# Patient Record
Sex: Male | Born: 1935 | Race: White | Hispanic: No | Marital: Married | Smoking: Former smoker
Health system: Southern US, Community
[De-identification: ages and names within clinical notes are randomized; demographics above are authoritative.]

## PROBLEM LIST (undated history)

## (undated) DIAGNOSIS — G4730 Sleep apnea, unspecified: Secondary | ICD-10-CM

## (undated) DIAGNOSIS — R918 Other nonspecific abnormal finding of lung field: Secondary | ICD-10-CM

## (undated) DIAGNOSIS — I10 Essential (primary) hypertension: Secondary | ICD-10-CM

## (undated) DIAGNOSIS — E119 Type 2 diabetes mellitus without complications: Secondary | ICD-10-CM

## (undated) DIAGNOSIS — G629 Polyneuropathy, unspecified: Secondary | ICD-10-CM

## (undated) DIAGNOSIS — R112 Nausea with vomiting, unspecified: Secondary | ICD-10-CM

## (undated) DIAGNOSIS — D649 Anemia, unspecified: Secondary | ICD-10-CM

## (undated) DIAGNOSIS — R0902 Hypoxemia: Secondary | ICD-10-CM

## (undated) DIAGNOSIS — N189 Chronic kidney disease, unspecified: Secondary | ICD-10-CM

## (undated) DIAGNOSIS — N179 Acute kidney failure, unspecified: Secondary | ICD-10-CM

## (undated) DIAGNOSIS — M1990 Unspecified osteoarthritis, unspecified site: Secondary | ICD-10-CM

## (undated) DIAGNOSIS — J45909 Unspecified asthma, uncomplicated: Secondary | ICD-10-CM

## (undated) DIAGNOSIS — T4145XA Adverse effect of unspecified anesthetic, initial encounter: Secondary | ICD-10-CM

## (undated) DIAGNOSIS — R609 Edema, unspecified: Secondary | ICD-10-CM

## (undated) DIAGNOSIS — Z9889 Other specified postprocedural states: Secondary | ICD-10-CM

## (undated) DIAGNOSIS — C801 Malignant (primary) neoplasm, unspecified: Secondary | ICD-10-CM

## (undated) DIAGNOSIS — N429 Disorder of prostate, unspecified: Secondary | ICD-10-CM

## (undated) DIAGNOSIS — T8859XA Other complications of anesthesia, initial encounter: Secondary | ICD-10-CM

## (undated) HISTORY — PX: JOINT REPLACEMENT: SHX530

## (undated) HISTORY — PX: BACK SURGERY: SHX140

## (undated) HISTORY — PX: FOREARM SURGERY: SHX651

## (undated) HISTORY — PX: OTHER SURGICAL HISTORY: SHX169

## (undated) HISTORY — PX: APPENDECTOMY: SHX54

---

## 2001-01-01 ENCOUNTER — Emergency Department (HOSPITAL_COMMUNITY): Admission: EM | Admit: 2001-01-01 | Discharge: 2001-01-01 | Payer: Self-pay | Admitting: Emergency Medicine

## 2001-02-25 ENCOUNTER — Emergency Department (HOSPITAL_COMMUNITY): Admission: EM | Admit: 2001-02-25 | Discharge: 2001-02-25 | Payer: Self-pay | Admitting: Emergency Medicine

## 2003-04-12 ENCOUNTER — Ambulatory Visit (HOSPITAL_COMMUNITY): Admission: RE | Admit: 2003-04-12 | Discharge: 2003-04-12 | Payer: Self-pay | Admitting: Gastroenterology

## 2004-07-15 ENCOUNTER — Emergency Department (HOSPITAL_COMMUNITY): Admission: EM | Admit: 2004-07-15 | Discharge: 2004-07-15 | Payer: Self-pay | Admitting: Emergency Medicine

## 2004-09-06 ENCOUNTER — Ambulatory Visit: Payer: Self-pay

## 2004-09-21 ENCOUNTER — Inpatient Hospital Stay (HOSPITAL_COMMUNITY): Admission: EM | Admit: 2004-09-21 | Discharge: 2004-09-25 | Payer: Self-pay | Admitting: Emergency Medicine

## 2004-09-21 ENCOUNTER — Ambulatory Visit: Payer: Self-pay | Admitting: Sports Medicine

## 2004-12-18 ENCOUNTER — Encounter: Payer: Self-pay | Admitting: Orthopedic Surgery

## 2004-12-23 ENCOUNTER — Encounter: Payer: Self-pay | Admitting: Orthopedic Surgery

## 2005-01-23 ENCOUNTER — Encounter: Payer: Self-pay | Admitting: Orthopedic Surgery

## 2005-02-23 ENCOUNTER — Encounter: Payer: Self-pay | Admitting: Orthopedic Surgery

## 2006-05-04 ENCOUNTER — Emergency Department (HOSPITAL_COMMUNITY): Admission: EM | Admit: 2006-05-04 | Discharge: 2006-05-04 | Payer: Self-pay | Admitting: Emergency Medicine

## 2008-05-04 ENCOUNTER — Ambulatory Visit: Payer: Self-pay | Admitting: Internal Medicine

## 2008-08-26 ENCOUNTER — Ambulatory Visit: Payer: Self-pay | Admitting: Internal Medicine

## 2008-12-11 ENCOUNTER — Inpatient Hospital Stay: Payer: Self-pay | Admitting: Internal Medicine

## 2008-12-28 ENCOUNTER — Ambulatory Visit: Payer: Self-pay | Admitting: Specialist

## 2009-05-02 ENCOUNTER — Emergency Department: Payer: Self-pay | Admitting: Emergency Medicine

## 2009-07-04 ENCOUNTER — Ambulatory Visit: Payer: Self-pay | Admitting: Specialist

## 2010-01-09 ENCOUNTER — Ambulatory Visit: Payer: Self-pay | Admitting: Specialist

## 2010-07-20 ENCOUNTER — Ambulatory Visit: Payer: Self-pay | Admitting: Specialist

## 2010-11-10 NOTE — Discharge Summary (Signed)
Keith Cain, MARKOVIC NO.:  [id number]   MEDICAL RECORD NO.:  [id number]          PATIENT TYPE:  INP   LOCATION:  5501                         FACILITY:  [facility]   PHYSICIAN:  Pearlean Brownie, M.D.DATE OF BIRTH:  12/28/1931   DATE OF ADMISSION:  09/21/2004  DATE OF DISCHARGE:  09/25/2004                                 DISCHARGE SUMMARY   DISCHARGE DIAGNOSES:  1.  Pyelonephritis.  2.  Prostatitis.  3.  Urinary retention secondary to obstructive uropathy.  4.  Acute renal insufficiency.  5.  Diabetes.  6.  Hypertension.   DISCHARGE MEDICATIONS:  1.  Ciprofloxacin 500 mg b.i.d. for 25 days.  2.  Tiazac 180 mg once a day.  3.  Flomax 0.4 SR at bedtime.  4.  Aspirin 81 mg once a day.  5.  Zocor 20 mg p.o. daily.  6.  Tylenol 500 mg 1-2 tabs q.4h. p.r.n. for pain.   RECOMMENDED DIET:  Carbohydrate modified diabetic diet.   SPECIAL INSTRUCTIONS:  The patient was instructed to continue to use Foley  catheter until advised by the urologist.  Also was advised to keep the bag  in a level below the waist.   FOLLOW UP:  The patient had a followup appointment with Dr. Annabell Howells in 1-2  weeks after discharge and was instructed to follow up with primary care  [name], Dr. __________ in one week.   HOSPITAL COURSE:  [name] is a 77 year old white male who presented to the  emergency department complaining of a six month history of left flank pain.  Recent increased urinary frequency and urgency.  Had been diagnosed the week  prior with a urinary tract infection and completed a five day course of  ciprofloxacin.  A urine culture was obtained by primary care [name] during  the last visit and showed E. Coli growth with sensitivity to ciprofloxacin  but treatment did not help with symptoms.  The patient also developed a  temperature of 101 prior to admission which prompted him to come to the  emergency department.  He also presented with emesis for the last two days  prior to admission.  Findings on a physical exam on admission were  remarkable for abdominal tenderness in the left upper quadrant.  Enlarged  prostate, 3.5 cm, nodular with right-sided prostate tenderness and boggy  prostate.  Otherwise, within normal limits.  Admission labs showed urine  with specific gravity of 1.022, pH 6.5, negative for glucose, moderate  hemoglobin, small bilirubin, and 15 ketones.  Protein greater than 300.  Positive nitrites and moderate leukocytes.  CBC showed a white blood cell  count of 13.1, hemoglobin 14.6, hematocrit 41, platelets 243.  Polymorphins  appear predominate 88%.  B-met showed sodium 134, potassium 4.7, chloride  103, bicarb 25.8, BUN 21, creatinine 1.4, glucose 144.  CT of the abdomen  showed a small nodular lesion in the right adrenal gland measuring about 1.2  mm, likely incidental adrenal adenoma.  __________ left greater than right.  Soft tissue stranding suggesting urinary tract obstruction.  Findings  compatible with multifactorial spinal stenosis.  No obstructing calculus was  detected.   Problem 1. Pyelonephritis.  The patient was started on Rocephin and  ciprofloxacin to complete a five day course and a  Foley catheter was also  placed on admission to straight drain.  The patient was instructed to  continue use the catheter after discharge until ___________.  Dr. Annabell Howells was  consulted and recommended to continue antibiotic treatment with Cipro for a  minimum of 30 days.  On discharge date, the patient had received a total of  five days of antibiotic treatment.  Was afebrile over 24 hours and was  instructed to continue ciprofloxacin treatment for 25 days more.   Problem 2. Urinary retention secondary to obstructive uropathy.  Was  improved after the placement of the Foley catheter, likely secondary to  infection since it was bilateral and no evidence of stones was shown on the  CT.  On discharge date, the patient had a great diuresis of  4.3 L in 24  hours. The patient was discharged on Flomax and will be followed by Dr.  Annabell Howells in 1-2 weeks.   Problem 3. Diabetes was stable, diet controlled.  The patient was instructed  to continue a carbohydrate modified diet and also was placed on Zocor and  aspirin prior to discharge.   Problem 4. Hypertension.  Remained stable.  The patient was advised to  continue on Diltiazem.   On discharge date, the patient was feeling well, had a good p.o. intake.  Denied any pain over 24 hours.  Vitals:  Temperature 98.3, blood pressure  129/66, heart rate 60, respiratory rate 20.  CBG was 124.  O2 sat 98% on  room air.  His CBC showed a white blood cell count of 10.4, hemoglobin 13.6,  hematocrit 39.5, platelets 242.  B-met showed sodium 140, potassium 4.1,  chloride 108, CO2 25, BUN 7, creatinine 0.8, glucose 90.  Calcium 8.8.  The  patient was discharged in improved and stable condition, to follow up with  primary care [name] in one week and with the urologist in 1.5-2 weeks.      AM/MEDQ  D:  11/17/2004  T:  11/18/2004  Job:  [id number]

## 2010-11-10 NOTE — Op Note (Signed)
NAME:  Keith Cain, PICKRELL NO.:  [id number]   MEDICAL RECORD NO.:  [id number]                   PATIENT TYPE:  AMB   LOCATION:  ENDO                                 FACILITY:  [facility]   PHYSICIAN:  Danise Edge, M.D.                DATE OF BIRTH:  10-01-1931   DATE OF PROCEDURE:  04/12/2003  DATE OF DISCHARGE:                                 OPERATIVE REPORT   PROCEDURE:  Colonoscopy.   PROCEDURE INDICATION:  Mr. Torren Maffeo is a 77 year old male born July 28, 1931.  When Mr. Siebers developed left-sided abdominal pain approximately  four months ago which did not resolve, he underwent a CT scan of the abdomen  and pelvis, which apparently showed colonic diverticulosis.  Mr. Ramsaran  received therapy for presumed acute diverticulitis, and his abdominal pain  has resolved.  His brother has undergone colonoscopic exams to remove  colonic polyps.  Mr. Scogin is scheduled to undergo a screening colonoscopy  with polypectomy to prevent colon cancer.   MEDICATION ALLERGIES:  None.   CURRENT MEDICATIONS:  Altace, diltiazem, Zocor, aspirin, Fibercon.   PAST MEDICAL/SURGICAL HISTORY:  1. L3 laminectomy.  2. Total right hip replacement surgery.  3. Hypertension.  4. Hypercholesterolemia.  5. Gout.  6. Asthma.  7. Remote cigarette smoker.  8. Rotator cuff surgery.  9. Melanoma removed, 1997.  10.      Colonic diverticulosis.   FAMILY HISTORY:  Brother with colon polyps.   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  His 73 year old wife is  in good health.  His two daughters and his son are in excellent health.   ENDOSCOPIST:  Danise Edge, M.D.   PREMEDICATION:  Versed 5 mg, Demerol 50 mg.   DESCRIPTION OF PROCEDURE:  After obtaining informed consent, Mr. Luecke was  placed in the left lateral decubitus position.  I administered intravenous  Demerol and intravenous Versed to achieve conscious sedation for the  procedure.  The patient's blood  pressure, oxygen saturation, and cardiac  rhythm were monitored throughout the procedure and documented in the medical  record.   Anal inspection was normal.  Digital rectal exam revealed a non-nodular  prostate.  The Olympus pediatric colonoscope was introduced into the rectum  and advanced to the cecum.  Colonic preparation for the exam today was  excellent.   Rectum normal.   Sigmoid colon and descending colon:  Left colonic diverticulosis.   Splenic flexure normal.   Transverse colon normal.   Hepatic flexure normal.   Ascending colon normal.   Cecum and ileocecal valve normal.   ASSESSMENT:  1. Left colonic diverticulosis.  2. No endoscopic evidence for the presence of colorectal neoplasia.   RECOMMENDATIONS:  Repeat colonoscopy (optical or virtual) in five years.  Danise Edge, M.D.    MJ/MEDQ  D:  04/12/2003  T:  04/12/2003  Job:  [id number]   cc:   Lianne Bushy, M.D.  [address]  [place]  [place] [address]  Fax: [phone]

## 2010-11-10 NOTE — Consult Note (Signed)
NAMEDEVARIS, QUIRK NO.:  [id number]   MEDICAL RECORD NO.:  [id number]          PATIENT TYPE:  INP   LOCATION:  5501                         FACILITY:  [facility]   PHYSICIAN:  Excell Seltzer. Annabell Howells, M.D.    DATE OF BIRTH:  December 13, 1931   DATE OF CONSULTATION:  09/22/2004  DATE OF DISCHARGE:                                   CONSULTATION   CHIEF COMPLAINT:  Left flank pain.   HISTORY OF PRESENT ILLNESS:  Mr. Keir is a 76 year old white male who  reports the original onset of some left flank pain approximately six months  ago.  He over the last two weeks developed increased urinary frequency with  urgency, small voids approximately every 30 minutes.  A little over a week  ago he went to see his medical doctor who gave him a five-day course of  Cipro.  A culture was obtained at that time which did show sensitivity to  Cipro with an E. coli.  He did not improve, went back to the doctor, and got  started on a course of Bactrim for presumed prostatitis that was to last 30  days.  He was scheduled to return to the doctor on Wednesday, but developed  more severe abdominal pain and came to the emergency room.  He had been  found to have a fever on Monday of 101.  After admission evaluation revealed  a creatinine of 1.4 which was felt to be abnormal for him.  The Bactrim was  stopped.  The Altace he had been on for his blood pressure was also stopped.  He was started on Rocephin and Cipro.  Since that time a CT scan was  obtained which demonstrated significant inflammatory changes in the  periureteral area bilaterally.  The bladder did not appear markedly dilated  but was difficult to visualize based on the presence of a hip replacement.  A Foley catheter, however, has been inserted because of some evidence of  obstruction of the upper tracts.  With this the patient has had complete  resolution of his pain and has had a brisk diuresis.  Additionally at the  time of the initial  physical exam his prostate was markedly enlarged with  nodules and tenderness, particularly on the right.  The prostate was felt to  be boggy.   PAST MEDICAL HISTORY:  1.  Significant for hypertension.  2.  Borderline diabetes.  3.  Arthritis with questionable gout.  4.  History of diverticulosis, although approximately six months ago he had      a colonoscopy which was unremarkable.   ALLERGIES:  1.  On review of the past medical history the patient has intolerance to      VICODIN.  2.  VERSED.  3.  MORPHINE.   MEDICATIONS ON ADMISSION:  1.  Aspirin 81 mg.  2.  Tiazac 180 mg daily.  3.  Altace 10 mg daily.  4.  Zocor 20 mg daily.  5.  Diovan 180 mg daily.   PAST SURGICAL HISTORY:  1.  Significant for back surgery in 1980.  2.  Right hip replacement x2, first in 2000, next in 2003.  3.  He had a right shoulder dislocation with rotator cuff injury earlier      this year.  4.  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  5.  Appendectomy in 1953.   SOCIAL HISTORY:  He denies tobacco or alcohol.  [place] and  recently retired from the [employer].   FAMILY HISTORY:  Significant for ovarian cancer, stroke, and heart disease.   REVIEW OF SYSTEMS:  He currently reports resolution of his flank pain but  had had left flank pain for approximately six months.  He reports resolution  of his irritative voiding symptoms which included frequency, urgency, and  small voids with lower abdominal discomfort.  He denies hematuria.  He has  had fever but that is abated as well.  He denies chest pain.  He denies  shortness of breath.  He denies swelling in his lower extremities.  He has  had some arthritis in his left foot.  He denies diarrhea or constipation.  He has had no headaches or dizziness.  He does have some chronic problems  with his right arm following an injury many years ago.  He is otherwise  without complaints.   PHYSICAL EXAMINATION:  VITAL SIGNS:  Blood  pressure is 130/74, pulse 98,  respirations 20, temperature 98.3 with a maximum of 100.9.  GENERAL:  In general he is a well-developed, well-nourished, somewhat obese  white male in no acute distress, alert and oriented x3.  HEENT:  Head and face normocephalic, atraumatic.  NECK:  Supple.  LUNGS:  Clear with normal effort.  HEART:  Regular rate and rhythm.  ABDOMEN:  Soft, obese, nontender, without masses, hepatosplenomegaly, or  hernias.  GENITOURINARY:  Exam reveals an unremarkable phallus with a Foley at the  meatus.  Scrotum is unremarkable.  Testicles bilaterally descended, normal  in size and consistency without masses or tenderness.  Epididymes are  unremarkable.  RECTAL:  Exam is not repeated because of the apparent acute prostatitis, but  the results are well-described in the chart.  EXTREMITIES:  Have full range of motion without edema.  He does have some  evidence of his prior injury in his right hand but is otherwise  neurologically intact.  SKIN:  Warm and dry.   LABORATORY DATA:  Admission labs:  White count was 13.1, hemoglobin 14.6.  He had a left shift.  CMP revealed a sodium of 133, glucose of 173,  creatinine of 1.9.  That was five o'clock last night.  It was 1.4 on initial  ISTAT.  His total bilirubin is 1.6.  Blood and urine cultures are pending.  Urine Gram's stain had gram negative rods.  Urinalysis was nitrite positive  with too numerous to count white cells, 11-10 red cells, many bacteria.  As  reported his outside urine culture grew an E. coli sensitive to Cipro.  Repeat lab work today demonstrated glucose at 151, BUN at 32, creatinine  1.7.  White count 7.4, hemoglobin 12.4.   I personally reviewed his CT films which reveal evidence of bilateral upper  tract obstruction, left greater than right, with periureteral and perirenal  inflammatory changes.  The bladder is not well assessed because of the hip prosthesis.  Prostate is prominent at 5.4 cm in size.   There are multiple  sigmoid diverticula and he has some multilevel , multifactorial spinal  stenosis.   IMPRESSION:  1.  Prostatitis and pyelonephritis.  This is probably acute on chronic with      symptoms dating back six months.  2.  Urinary retention with probable obstructive uropathy.  He has had a      brisk diuresis since placement of Foley.  3.  Acute renal insufficiency.  4.  History of diabetes and hypertension.   RECOMMENDATIONS:  1.  At this point I would leave the Foley to straight drain.  He should have      this in for several days to allow further treatment of his infection.  I      would like to have him return to see me in the office in about a week      and a half to two weeks, at which time the catheter will be removed for      a voiding trial and possible cystoscopy would be performed.  2.  I would continue the Cipro for a minimum of 30 days.  It probably did      not work initially because of the obstruction.  3.  He will need a PSA and a repeat rectal exam and follow-up with me in 1-      1/2 weeks.  4.  I would follow his creatinine.  I would assume it is going to normalize      since he is now diuresing well, but if the creatinine remains elevated,      a repeat CT scan may be required to ensure that he does not have      residual obstruction.      JJW/MEDQ  D:  09/22/2004  T:  09/23/2004  Job:  [id number]   cc:   Pearlean Brownie, M.D.  Fax: [phone]   Lianne Bushy, M.D.  [address]  [place]  [place] [address]  Fax: [phone]   Excell Seltzer. Annabell Howells, M.D.  [address]  [place]  [place] [address]  Fax: [phone]

## 2010-11-10 NOTE — H&P (Signed)
Keith Cain, Keith Cain NO.:  [id number]   MEDICAL RECORD NO.:  [id number]          PATIENT TYPE:  INP   LOCATION:  1827                         FACILITY:  [facility]   PHYSICIAN:  Melina Fiddler, MD DATE OF BIRTH:  Nov 30, 1931   DATE OF ADMISSION:  09/21/2004  DATE OF DISCHARGE:                                HISTORY & PHYSICAL   CHIEF COMPLAINT:  Left flank to scrotal pain.   HISTORY OF PRESENT ILLNESS:  The patient came in with left-sided pain  radiating from his flank to his groin.  Six months ago, the patient reported  that he had left kidney pain.  Now he has had 2 weeks of increased urinary  frequency.  He has to urinate every 30 minutes.  When he urinates, only a  tiny amount of urine comes out.  His urine is cloudy.  He went to his  outpatient primary care physician and was given a 5-day course of  antibiotics.  This morning, he reports he woke up with pain that extended  from his left flank to his scrotum.  He has had emesis for 2 days, and he  has had a fever on Monday, which is now 3 days ago, a temperature of 101.   REVIEW OF SYSTEMS:  Right shoulder pain and right hand deformity.  Otherwise, negative.   PAST MEDICAL HISTORY:  1.  Melanoma, left flank, in 1997.  2.  Right shoulder dislocation in January of 2006 with rotator cuff injury.  3.  Rheumatoid arthritis.  4.  Back surgery in 1980.  5.  Right hip replacement x2 in 2000 and 2003.  6.  Diverticulosis.  7.  Diet-controlled diabetes mellitus type 2.  8.  Hypertension.  9.  Hyperlipidemia.  10. Claustrophobia.  11. Gout.   MEDICATIONS:  1.  Aspirin 81 mg p.o. q.24h.  2.  Tiazac 180 mg p.o. q.24h.  3.  Altace 10 mg p.o. q.24h.  4.  Zocor 20 mg p.o. q.24h.   ALLERGIES:  1.  VICODIN makes the patient sick.  2.  VERSED gives him apnea.  3.  MORPHINE causes blindness and diaphoresis.   FAMILY HISTORY:  Mother had ovarian cancer.  Father had 5 MIs and died of a  CVA.  Brother has  intestinal polyps.   SOCIAL HISTORY:  The patient lives with his wife on a farm.  He does not  smoke, does not drink, does not use drugs.   PHYSICAL EXAMINATION:  VITAL SIGNS:  Vital signs in the ED revealed a  temperature of 98.4 (previously 97.8 in the ED), heart rate 70, blood  pressure 100/67, respiratory rate 16, and satting 96% on room air.  GENERAL:  Obese male in no acute distress.  HEENT:  Moist mucous membranes.  Oropharynx without erythema or exudate.  No  nasal drainage.  Extraocular movements intact.  No lymphadenopathy.  CARDIOVASCULAR:  Regular rate and rhythm and rhythm.  No murmurs, rubs, or  gallops.  RESPIRATORY:  Clear to auscultation bilaterally.  No wheezing, rales, or  rhonchi.  ABDOMEN:  Soft, nontender.  Tenderness in  the left upper quadrant.  Normal  abdominal bowel sounds.  GENITOURINARY:  No flank or CVA tenderness bilaterally.  Prostate is  enlarged at 3.5 cm, nodular, and right-sided prostate tenderness.  It is a  boggy prostate.   LABORATORIES AND STUDIES:  In the emergency department, a UA showed amber  hazy urine, specific gravity 1.022, pH 6.5, no glucose, moderate hemoglobin.  Small bilirubin, 15 ketones.  Total protein greater than 300, 0.2  urobilinogen, positive nitrites, and moderate leukocytes.  i-STAT labs -  white blood cell count of 13.1, hemoglobin 14.6, hematocrit 41, platelets  243, PMM% 88.  Sodium 134, potassium 4.7, chloride 103, bicarb 25.8, BUN 21,  creatinine 1.4, glucose 144.  __________ pH of 7.437, PCO2 of 38.3, bicarb  25.8, total CO2 of 27, acid base excess 2.  Hemoglobin negative.  Urine  microscopic is pending.  CT of the abdomen and pelvis is pending.   ASSESSMENT AND PLAN:  The patient is a 77 year old male with history of  diabetes mellitus, hypertension, hyperlipidemia, here with bilateral  pyelonephritis and bacterial prostatitis.  1.  Bilateral pyelonephritis and prostatitis.  We will hydrate the patient      with 500  cc of normal saline bolus followed by D5 half normal saline at      125 cc per hour.  In addition, will start the patient on Cipro 500 mg IV      b.i.d.  His creatinine is 1.4, with creatinine clearance of 49.5 ml per      minute.  We will get a UA and urine culture, along with urine Gram stain      and blood cultures x2.  White blood cell count is currently not elevated      but will follow a.m. CBC, CMP, and will keep strict I&O's.  2.  Enlarged, tender, nodular boggy prostate.  This is likely BPH, as the      prostate is boggy; however, we cannot rule out prostate cancer.  It does      not make sense to get a PSA now, as he has had 2 recent digital prostate      exams, and the prostate is current inflamed.  His primary care physician      may have a baseline PSA level, as well as a recent PSA level.  The      patient reported a decreased rate of urination, and this could be due to      his enlarged prostate.  We will give him Flomax 0.4 mg p.o. q.h.s.  3.  Diabetes mellitus.  The patient is not on any treatment for diabetes      mellitus.  It is diet controlled.  We will monitor CBGs occasionally,      and will keep him on a carb modified diet.  4.  Hypertension.  Will put the patient back on his home Tiazac, and will      monitor his blood pressure.  We will not give him Altace at this point,      with a creatinine of 1.4.  5.  Hyperlipidemia.  We will continue his home Zocor.  6.  Fluids, electrolytes, and nutrition/GI:  Will put the patient on a      modified diet.  He has not had emesis since this morning, and believes      he can tolerate p.o.  IV fluids - will give him a 500 cc normal saline      bolus, followed  by 125 cc per hour of D5 half normal saline.  7.  Acute renal insufficiency.  The patient has a creatinine of 1.4, which      is elevated for a 77 year old male.  This is probably secondary to his     infection in his kidneys, and we expect that it will resolve with       resolution of the infection.  8.  Pain management.  The patient will be given Tylenol q.6h. for pain as      needed, as well as Percocet as needed.    RSB/MEDQ  D:  09/21/2004  T:  09/21/2004  Job:  [id number]

## 2011-10-02 ENCOUNTER — Emergency Department: Payer: Self-pay | Admitting: Emergency Medicine

## 2012-04-28 ENCOUNTER — Ambulatory Visit: Payer: Self-pay | Admitting: Specialist

## 2013-02-16 ENCOUNTER — Ambulatory Visit: Payer: Self-pay | Admitting: Internal Medicine

## 2013-09-01 DIAGNOSIS — I2510 Atherosclerotic heart disease of native coronary artery without angina pectoris: Secondary | ICD-10-CM | POA: Insufficient documentation

## 2013-09-01 DIAGNOSIS — I10 Essential (primary) hypertension: Secondary | ICD-10-CM | POA: Insufficient documentation

## 2013-12-30 DIAGNOSIS — N400 Enlarged prostate without lower urinary tract symptoms: Secondary | ICD-10-CM | POA: Insufficient documentation

## 2015-01-10 DIAGNOSIS — E782 Mixed hyperlipidemia: Secondary | ICD-10-CM | POA: Insufficient documentation

## 2015-01-10 DIAGNOSIS — E1169 Type 2 diabetes mellitus with other specified complication: Secondary | ICD-10-CM | POA: Insufficient documentation

## 2015-01-10 DIAGNOSIS — E538 Deficiency of other specified B group vitamins: Secondary | ICD-10-CM | POA: Insufficient documentation

## 2015-06-17 ENCOUNTER — Ambulatory Visit: Payer: Medicare Other | Attending: Neurology

## 2015-06-17 DIAGNOSIS — G4733 Obstructive sleep apnea (adult) (pediatric): Secondary | ICD-10-CM | POA: Diagnosis not present

## 2015-06-17 DIAGNOSIS — I509 Heart failure, unspecified: Secondary | ICD-10-CM | POA: Insufficient documentation

## 2015-07-13 DIAGNOSIS — G4733 Obstructive sleep apnea (adult) (pediatric): Secondary | ICD-10-CM | POA: Insufficient documentation

## 2016-08-15 ENCOUNTER — Encounter: Payer: Self-pay | Admitting: *Deleted

## 2016-08-30 ENCOUNTER — Ambulatory Visit: Payer: Medicare Other | Admitting: Anesthesiology

## 2016-08-30 ENCOUNTER — Ambulatory Visit
Admission: RE | Admit: 2016-08-30 | Discharge: 2016-08-30 | Disposition: A | Discharge status: Home / Self Care | Payer: Medicare Other | Source: Ambulatory Visit | Attending: Ophthalmology | Admitting: Ophthalmology

## 2016-08-30 ENCOUNTER — Encounter
Admission: RE | Disposition: A | Discharge status: Home / Self Care | Payer: Self-pay | Source: Ambulatory Visit | Attending: Ophthalmology

## 2016-08-30 ENCOUNTER — Encounter: Payer: Self-pay | Admitting: *Deleted

## 2016-08-30 DIAGNOSIS — Z87891 Personal history of nicotine dependence: Secondary | ICD-10-CM | POA: Diagnosis not present

## 2016-08-30 DIAGNOSIS — G4730 Sleep apnea, unspecified: Secondary | ICD-10-CM | POA: Diagnosis not present

## 2016-08-30 DIAGNOSIS — M1990 Unspecified osteoarthritis, unspecified site: Secondary | ICD-10-CM | POA: Diagnosis not present

## 2016-08-30 DIAGNOSIS — N189 Chronic kidney disease, unspecified: Secondary | ICD-10-CM | POA: Diagnosis not present

## 2016-08-30 DIAGNOSIS — E1122 Type 2 diabetes mellitus with diabetic chronic kidney disease: Secondary | ICD-10-CM | POA: Insufficient documentation

## 2016-08-30 DIAGNOSIS — J45909 Unspecified asthma, uncomplicated: Secondary | ICD-10-CM | POA: Diagnosis not present

## 2016-08-30 DIAGNOSIS — E1136 Type 2 diabetes mellitus with diabetic cataract: Secondary | ICD-10-CM | POA: Insufficient documentation

## 2016-08-30 DIAGNOSIS — I10 Essential (primary) hypertension: Secondary | ICD-10-CM | POA: Diagnosis not present

## 2016-08-30 HISTORY — DX: Edema, unspecified: R60.9

## 2016-08-30 HISTORY — DX: Unspecified osteoarthritis, unspecified site: M19.90

## 2016-08-30 HISTORY — DX: Other complications of anesthesia, initial encounter: T88.59XA

## 2016-08-30 HISTORY — PX: CATARACT EXTRACTION W/PHACO: SHX586

## 2016-08-30 HISTORY — DX: Chronic kidney disease, unspecified: N18.9

## 2016-08-30 HISTORY — DX: Essential (primary) hypertension: I10

## 2016-08-30 HISTORY — DX: Hypoxemia: R09.02

## 2016-08-30 HISTORY — DX: Type 2 diabetes mellitus without complications: E11.9

## 2016-08-30 HISTORY — DX: Other nonspecific abnormal finding of lung field: R91.8

## 2016-08-30 HISTORY — DX: Unspecified asthma, uncomplicated: J45.909

## 2016-08-30 HISTORY — DX: Disorder of prostate, unspecified: N42.9

## 2016-08-30 HISTORY — DX: Sleep apnea, unspecified: G47.30

## 2016-08-30 HISTORY — DX: Adverse effect of unspecified anesthetic, initial encounter: T41.45XA

## 2016-08-30 LAB — GLUCOSE, CAPILLARY: Glucose-Capillary: 140 mg/dL — ABNORMAL HIGH (ref 65–99)

## 2016-08-30 SURGERY — PHACOEMULSIFICATION, CATARACT, WITH IOL INSERTION
Anesthesia: Monitor Anesthesia Care | Site: Eye | Laterality: Right | Wound class: Clean

## 2016-08-30 MED ORDER — MOXIFLOXACIN HCL 0.5 % OP SOLN: 1.0000 [drp] | OPHTHALMIC | Status: DC | PRN

## 2016-08-30 MED ORDER — MOXIFLOXACIN HCL 0.5 % OP SOLN
OPHTHALMIC | Status: AC
  Filled 2016-08-30: qty 3

## 2016-08-30 MED ORDER — SODIUM HYALURONATE 10 MG/ML IO SOLN
INTRAOCULAR | Status: DC | PRN
  Administered 2016-08-30: 0.55 mL via INTRAOCULAR

## 2016-08-30 MED ORDER — SODIUM CHLORIDE 0.9 % IV SOLN
INTRAVENOUS | Status: DC
  Administered 2016-08-30: 07:00:00 via INTRAVENOUS

## 2016-08-30 MED ORDER — LIDOCAINE HCL (PF) 4 % IJ SOLN
INTRAOCULAR | Status: DC | PRN
  Administered 2016-08-30: 4 mL via OPHTHALMIC

## 2016-08-30 MED ORDER — ARMC OPHTHALMIC DILATING DROPS
OPHTHALMIC | Status: AC
  Administered 2016-08-30: 1 via OPHTHALMIC
  Filled 2016-08-30: qty 0.4

## 2016-08-30 MED ORDER — MOXIFLOXACIN HCL 0.5 % OP SOLN
OPHTHALMIC | Status: DC | PRN
  Administered 2016-08-30: 9 mL via OPHTHALMIC

## 2016-08-30 MED ORDER — ARMC OPHTHALMIC DILATING DROPS
1.0000 "application " | OPHTHALMIC | Status: AC
  Administered 2016-08-30 (×3): 1 via OPHTHALMIC

## 2016-08-30 MED ORDER — SODIUM HYALURONATE 23 MG/ML IO SOLN
INTRAOCULAR | Status: DC | PRN
  Administered 2016-08-30: 0.6 mL via INTRAOCULAR

## 2016-08-30 MED ORDER — ONDANSETRON HCL 4 MG/2ML IJ SOLN
INTRAMUSCULAR | Status: DC | PRN
  Administered 2016-08-30: 4 mg via INTRAVENOUS

## 2016-08-30 MED ORDER — LIDOCAINE HCL (PF) 2 % IJ SOLN
INTRAMUSCULAR | Status: AC
  Filled 2016-08-30: qty 2

## 2016-08-30 MED ORDER — BSS IO SOLN
INTRAOCULAR | Status: DC | PRN
  Administered 2016-08-30: 200 mL via INTRAOCULAR

## 2016-08-30 MED ORDER — POVIDONE-IODINE 5 % OP SOLN
OPHTHALMIC | Status: AC
  Filled 2016-08-30: qty 30

## 2016-08-30 MED ORDER — EPINEPHRINE PF 1 MG/ML IJ SOLN
INTRAMUSCULAR | Status: AC
  Filled 2016-08-30: qty 1

## 2016-08-30 MED ORDER — SODIUM HYALURONATE 10 MG/ML IO SOLN
INTRAOCULAR | Status: AC
  Filled 2016-08-30: qty 0.85

## 2016-08-30 MED ORDER — SODIUM HYALURONATE 23 MG/ML IO SOLN
INTRAOCULAR | Status: AC
  Filled 2016-08-30: qty 0.6

## 2016-08-30 SURGICAL SUPPLY — 20 items
CANNULA ANT/CHMB 27GA (MISCELLANEOUS) ×6 IMPLANT
CUP MEDICINE 2OZ PLAST GRAD ST (MISCELLANEOUS) ×3 IMPLANT
DISSECTOR HYDRO NUCLEUS 50X22 (MISCELLANEOUS) ×3 IMPLANT
GLOVE BIO SURGEON STRL SZ8 (GLOVE) ×3 IMPLANT
GLOVE BIOGEL M 6.5 STRL (GLOVE) ×3 IMPLANT
GLOVE SURG LX 7.5 STRW (GLOVE) ×2
GLOVE SURG LX STRL 7.5 STRW (GLOVE) ×1 IMPLANT
GOWN STRL REUS W/ TWL LRG LVL3 (GOWN DISPOSABLE) ×2 IMPLANT
GOWN STRL REUS W/TWL LRG LVL3 (GOWN DISPOSABLE) ×4
LENS IOL TECNIS ITEC 22.0 (Intraocular Lens) ×3 IMPLANT
PACK CATARACT (MISCELLANEOUS) ×3 IMPLANT
PACK CATARACT KING (MISCELLANEOUS) ×3 IMPLANT
PACK EYE AFTER SURG (MISCELLANEOUS) ×3 IMPLANT
SOL BSS BAG (MISCELLANEOUS) ×3
SOLUTION BSS BAG (MISCELLANEOUS) ×1 IMPLANT
SYR 3ML LL SCALE MARK (SYRINGE) ×6 IMPLANT
SYR 5ML LL (SYRINGE) ×3 IMPLANT
SYR TB 1ML 27GX1/2 LL (SYRINGE) ×3 IMPLANT
WATER STERILE IRR 250ML POUR (IV SOLUTION) ×3 IMPLANT
WIPE NON LINTING 3.25X3.25 (MISCELLANEOUS) ×3 IMPLANT

## 2016-08-30 NOTE — Anesthesia Post-op Follow-up Note (Cosign Needed)
Anesthesia QCDR form completed.        

## 2016-08-30 NOTE — Op Note (Signed)
OPERATIVE NOTE  Keith Cain [id number] 08/30/2016   PREOPERATIVE DIAGNOSIS:  Nuclear sclerotic cataract right eye.  H25.11   POSTOPERATIVE DIAGNOSIS:    Nuclear sclerotic cataract right eye.     PROCEDURE:  Phacoemusification with posterior chamber intraocular lens placement of the right eye   LENS:   Implant Name Type Inv. Item Serial No. Manufacturer Lot No. LRB No. Used  LENS IOL DIOP 22.0 - [id number] Intraocular Lens LENS IOL DIOP 22.0 [id number] AMO   Right 1       PCB00 +22.0   ULTRASOUND TIME: 0 minutes 55 seconds.  CDE 6.08   SURGEON:  Benay Pillow, MD, MPH  ANESTHESIOLOGIST: Anesthesiologist: Molli Barrows, MD CRNA: Doreen Salvage, CRNA   ANESTHESIA:  Topical with tetracaine drops augmented with 1% preservative-free intracameral lidocaine.  ESTIMATED BLOOD LOSS: less than 1 mL.   COMPLICATIONS:  None.   DESCRIPTION OF PROCEDURE:  The patient was identified in the holding room and transported to the operating room and placed in the supine position under the operating microscope.  The right eye was identified as the operative eye and it was prepped and draped in the usual sterile ophthalmic fashion.   A 1.0 millimeter clear-corneal paracentesis was made at the 10:30 position. 0.5 ml of preservative-free 1% lidocaine with epinephrine was injected into the anterior chamber.  The anterior chamber was filled with Healon 5 viscoelastic.  A 2.4 millimeter keratome was used to make a near-clear corneal incision at the 8:00 position.  A curvilinear capsulorrhexis was made with a cystotome and capsulorrhexis forceps.  Balanced salt solution was used to hydrodissect and hydrodelineate the nucleus.   Phacoemulsification was then used in stop and chop fashion to remove the lens nucleus and epinucleus.  The remaining cortex was then removed using the irrigation and aspiration handpiece. Healon was then placed into the capsular bag to distend it for lens placement.  A lens was then  injected into the capsular bag.  The remaining viscoelastic was aspirated.   Wounds were hydrated with balanced salt solution.  The anterior chamber was inflated to a physiologic pressure with balanced salt solution.   Intracameral vigamox 0.1 mL undiluted was injected into the eye and a drop placed onto the ocular surface.  No wound leaks were noted.  The patient was taken to the recovery room in stable condition without complications of anesthesia or surgery  Benay Pillow 08/30/2016, 8:05 AM

## 2016-08-30 NOTE — Anesthesia Procedure Notes (Signed)
Procedure Name: MAC Date/Time: 08/30/2016 7:30 AM Performed by: Doreen Salvage Pre-anesthesia Checklist: Patient identified, Emergency Drugs available, Suction available and Patient being monitored Patient Re-evaluated:Patient Re-evaluated prior to inductionOxygen Delivery Method: Nasal cannula

## 2016-08-30 NOTE — Transfer of Care (Signed)
Immediate Anesthesia Transfer of Care Note  Patient: Keith Cain  Procedure(s) Performed: Procedure(s) with comments: CATARACT EXTRACTION PHACO AND INTRAOCULAR LENS PLACEMENT (IOC) (Right) - Lot# [id number] H [place]: 00:55.5 AP%: 11.0 CDE: 6.08  Patient Location: PHASE II  Anesthesia Type:MAC  Level of Consciousness: Awake, Alert, Oriented  Airway & Oxygen Therapy: Patient Spontanous Breathing and Patient on room air   Post-op Assessment: Report given to RN and Post -op Vital signs reviewed and stable  Post vital signs: Reviewed and stable  Last Vitals:  Vitals:   08/30/16 0612 08/30/16 0805  BP: (!) 180/69 (!) 143/62  Pulse: (!) 54 (!) 50  Resp: 20 14  Temp: 36.7 C 01.5 C    Complications: No apparent anesthesia complications

## 2016-08-30 NOTE — Anesthesia Postprocedure Evaluation (Signed)
Anesthesia Post Note  Patient: Keith Cain  Procedure(s) Performed: Procedure(s) (LRB): CATARACT EXTRACTION PHACO AND INTRAOCULAR LENS PLACEMENT (IOC) (Right)  Patient location during evaluation: PACU Anesthesia Type: MAC Level of consciousness: awake and alert Pain management: pain level controlled Vital Signs Assessment: post-procedure vital signs reviewed and stable Respiratory status: spontaneous breathing, nonlabored ventilation, respiratory function stable and patient connected to nasal cannula oxygen Cardiovascular status: stable and blood pressure returned to baseline Anesthetic complications: no     Last Vitals:  Vitals:   08/30/16 0612 08/30/16 0805  BP: (!) 180/69 (!) 143/62  Pulse: (!) 54 (!) 50  Resp: 20 14  Temp: 36.7 C 36.5 C    Last Pain:  Vitals:   08/30/16 0805  TempSrc: Temporal                 Alison Stalling

## 2016-08-30 NOTE — Anesthesia Preprocedure Evaluation (Signed)
Anesthesia Evaluation  Patient identified by MRN, date of birth, ID band Patient awake    Reviewed: Allergy & Precautions, H&P , NPO status , Patient's Chart, lab work & pertinent test results, reviewed documented beta blocker date and time   History of Anesthesia Complications (+) history of anesthetic complications  Airway Mallampati: II  TM Distance: >3 FB Neck ROM: full    Dental no notable dental hx. (+) Poor Dentition, Teeth Intact   Pulmonary neg pulmonary ROS, asthma , sleep apnea and Continuous Positive Airway Pressure Ventilation , former smoker,    Pulmonary exam normal breath sounds clear to auscultation       Cardiovascular Exercise Tolerance: Good hypertension, negative cardio ROS Normal cardiovascular exam Rhythm:regular Rate:Normal     Neuro/Psych negative neurological ROS  negative psych ROS   GI/Hepatic negative GI ROS, Neg liver ROS,   Endo/Other  negative endocrine ROSdiabetes  Renal/GU CRFRenal diseasenegative Renal ROS  negative genitourinary   Musculoskeletal   Abdominal   Peds  Hematology negative hematology ROS (+)   Anesthesia Other Findings Past Medical History: No date: Arthritis     Comment: RA No date: Asthma No date: Chronic kidney disease No date: Complication of anesthesia     Comment: HARD TO WAKE UP PATIENT STATES PCP SAID NO IV               SEDATION/ HIGH RISK SINCE HARD TO WAKE UP No date: Diabetes mellitus without complication (HCC) No date: Edema No date: Hypertension No date: Lung nodules No date: Oxygen deficiency     Comment: WEARS HS No date: Prostate disorder No date: Sleep apnea     Comment: CPAP Past Surgical History: No date: APPENDECTOMY No date: FOREARM SURGERY     Comment: RIGHT No date: JOINT REPLACEMENT     Comment: TH X 3 No date: RCR BMI    Body Mass Index:  40.45 kg/m     Reproductive/Obstetrics negative OB ROS                              Anesthesia Physical Anesthesia Plan  ASA: III  Anesthesia Plan: MAC   Post-op Pain Management:    Induction:   Airway Management Planned:   Additional Equipment:   Intra-op Plan:   Post-operative Plan:   Informed Consent: I have reviewed the patients History and Physical, chart, labs and discussed the procedure including the risks, benefits and alternatives for the proposed anesthesia with the patient or authorized representative who has indicated his/her understanding and acceptance.   Dental Advisory Given  Plan Discussed with: CRNA  Anesthesia Plan Comments:         Anesthesia Quick Evaluation

## 2016-08-30 NOTE — Discharge Instructions (Signed)
Eye Surgery Discharge Instructions  Expect mild scratchy sensation or mild soreness. DO NOT RUB YOUR EYE!  The day of surgery:  Minimal physical activity, but bed rest is not required  No reading, computer work, or close hand work  No bending, lifting, or straining.  May watch TV  For 24 hours:  No driving, legal decisions, or alcoholic beverages  Safety precautions  Eat anything you prefer: It is better to start with liquids, then soup then solid foods.  _____ Eye patch should be worn until postoperative exam tomorrow.  ____ Solar shield eyeglasses should be worn for comfort in the sunlight/patch while sleeping  Resume all regular medications including aspirin or Coumadin if these were discontinued prior to surgery. You may shower, bathe, shave, or wash your hair. Tylenol may be taken for mild discomfort.  Call your doctor if you experience significant pain, nausea, or vomiting, fever > 101 or other signs of infection. [phone] or [phone] Specific instructions:  Follow-up Information    Benay Pillow, MD Follow up.   Specialty:  Ophthalmology Why:  March 9 at 9:15am Contact information: [address] [place] [address] [phone]

## 2016-08-30 NOTE — H&P (Signed)
The History and Physical notes are on paper, have been signed, and are to be scanned.   I have examined the patient and there are no changes to the H&P.   Benay Pillow 08/30/2016 7:19 AM

## 2016-09-13 ENCOUNTER — Ambulatory Visit: Payer: Medicare Other | Attending: Internal Medicine

## 2016-09-13 DIAGNOSIS — G4733 Obstructive sleep apnea (adult) (pediatric): Secondary | ICD-10-CM | POA: Diagnosis not present

## 2016-09-13 DIAGNOSIS — Z6841 Body Mass Index (BMI) 40.0 and over, adult: Secondary | ICD-10-CM | POA: Insufficient documentation

## 2016-09-13 DIAGNOSIS — E669 Obesity, unspecified: Secondary | ICD-10-CM | POA: Insufficient documentation

## 2016-09-13 DIAGNOSIS — R0683 Snoring: Secondary | ICD-10-CM | POA: Diagnosis present

## 2016-09-24 ENCOUNTER — Encounter: Payer: Self-pay | Admitting: *Deleted

## 2016-09-27 ENCOUNTER — Ambulatory Visit: Payer: Medicare Other | Admitting: Anesthesiology

## 2016-09-27 ENCOUNTER — Encounter
Admission: RE | Disposition: A | Discharge status: Home / Self Care | Payer: Self-pay | Source: Ambulatory Visit | Attending: Ophthalmology

## 2016-09-27 ENCOUNTER — Encounter: Payer: Self-pay | Admitting: *Deleted

## 2016-09-27 ENCOUNTER — Ambulatory Visit
Admission: RE | Admit: 2016-09-27 | Discharge: 2016-09-27 | Disposition: A | Discharge status: Home / Self Care | Payer: Medicare Other | Source: Ambulatory Visit | Attending: Ophthalmology | Admitting: Ophthalmology

## 2016-09-27 DIAGNOSIS — G4730 Sleep apnea, unspecified: Secondary | ICD-10-CM | POA: Diagnosis not present

## 2016-09-27 DIAGNOSIS — Z79899 Other long term (current) drug therapy: Secondary | ICD-10-CM | POA: Insufficient documentation

## 2016-09-27 DIAGNOSIS — J45909 Unspecified asthma, uncomplicated: Secondary | ICD-10-CM | POA: Diagnosis not present

## 2016-09-27 DIAGNOSIS — M1990 Unspecified osteoarthritis, unspecified site: Secondary | ICD-10-CM | POA: Insufficient documentation

## 2016-09-27 DIAGNOSIS — Z87891 Personal history of nicotine dependence: Secondary | ICD-10-CM | POA: Diagnosis not present

## 2016-09-27 DIAGNOSIS — I10 Essential (primary) hypertension: Secondary | ICD-10-CM | POA: Insufficient documentation

## 2016-09-27 DIAGNOSIS — E1136 Type 2 diabetes mellitus with diabetic cataract: Secondary | ICD-10-CM | POA: Insufficient documentation

## 2016-09-27 DIAGNOSIS — Z6841 Body Mass Index (BMI) 40.0 and over, adult: Secondary | ICD-10-CM | POA: Insufficient documentation

## 2016-09-27 DIAGNOSIS — N289 Disorder of kidney and ureter, unspecified: Secondary | ICD-10-CM | POA: Diagnosis not present

## 2016-09-27 HISTORY — DX: Malignant (primary) neoplasm, unspecified: C80.1

## 2016-09-27 HISTORY — PX: CATARACT EXTRACTION W/PHACO: SHX586

## 2016-09-27 LAB — GLUCOSE, CAPILLARY: GLUCOSE-CAPILLARY: 160 mg/dL — AB (ref 65–99)

## 2016-09-27 SURGERY — PHACOEMULSIFICATION, CATARACT, WITH IOL INSERTION
Anesthesia: Monitor Anesthesia Care | Site: Eye | Laterality: Left | Wound class: Clean

## 2016-09-27 MED ORDER — SODIUM CHLORIDE 0.9 % IV SOLN
INTRAVENOUS | Status: DC
  Administered 2016-09-27: 08:00:00 via INTRAVENOUS

## 2016-09-27 MED ORDER — ARMC OPHTHALMIC DILATING DROPS
1.0000 "application " | OPHTHALMIC | Status: AC
  Administered 2016-09-27 (×3): 1 via OPHTHALMIC

## 2016-09-27 MED ORDER — POVIDONE-IODINE 5 % OP SOLN
OPHTHALMIC | Status: DC | PRN
  Administered 2016-09-27: 1 via OPHTHALMIC

## 2016-09-27 MED ORDER — LIDOCAINE HCL (PF) 2 % IJ SOLN
INTRAMUSCULAR | Status: AC
  Filled 2016-09-27: qty 2

## 2016-09-27 MED ORDER — SODIUM HYALURONATE 23 MG/ML IO SOLN
INTRAOCULAR | Status: DC | PRN
  Administered 2016-09-27: 0.6 mL via INTRAOCULAR

## 2016-09-27 MED ORDER — ARMC OPHTHALMIC DILATING DROPS
OPHTHALMIC | Status: AC
  Administered 2016-09-27: 1 via OPHTHALMIC
  Filled 2016-09-27: qty 0.4

## 2016-09-27 MED ORDER — SODIUM HYALURONATE 10 MG/ML IO SOLN
INTRAOCULAR | Status: DC | PRN
  Administered 2016-09-27: 0.55 mL via INTRAOCULAR

## 2016-09-27 MED ORDER — POVIDONE-IODINE 5 % OP SOLN
OPHTHALMIC | Status: AC
  Filled 2016-09-27: qty 60

## 2016-09-27 MED ORDER — EPINEPHRINE PF 1 MG/ML IJ SOLN
INTRAOCULAR | Status: DC | PRN
  Administered 2016-09-27: 09:00:00 via OPHTHALMIC

## 2016-09-27 MED ORDER — SODIUM HYALURONATE 10 MG/ML IO SOLN
INTRAOCULAR | Status: AC
  Filled 2016-09-27: qty 0.85

## 2016-09-27 MED ORDER — MOXIFLOXACIN HCL 0.5 % OP SOLN
OPHTHALMIC | Status: AC
  Filled 2016-09-27: qty 3

## 2016-09-27 MED ORDER — EPINEPHRINE PF 1 MG/ML IJ SOLN
INTRAMUSCULAR | Status: AC
  Filled 2016-09-27: qty 2

## 2016-09-27 MED ORDER — MOXIFLOXACIN HCL 0.5 % OP SOLN: 1.0000 [drp] | OPHTHALMIC | Status: DC | PRN

## 2016-09-27 MED ORDER — LIDOCAINE HCL (PF) 4 % IJ SOLN
INTRAOCULAR | Status: DC | PRN
  Administered 2016-09-27: 4 mL via OPHTHALMIC

## 2016-09-27 MED ORDER — SODIUM HYALURONATE 23 MG/ML IO SOLN
INTRAOCULAR | Status: AC
  Filled 2016-09-27: qty 0.6

## 2016-09-27 MED ORDER — MOXIFLOXACIN HCL 0.5 % OP SOLN
OPHTHALMIC | Status: DC | PRN
  Administered 2016-09-27: 0.2 mL via OPHTHALMIC

## 2016-09-27 MED ORDER — POVIDONE-IODINE 5 % OP SOLN
OPHTHALMIC | Status: AC
  Filled 2016-09-27: qty 30

## 2016-09-27 SURGICAL SUPPLY — 20 items
CANNULA ANT/CHMB 27GA (MISCELLANEOUS) ×6 IMPLANT
CUP MEDICINE 2OZ PLAST GRAD ST (MISCELLANEOUS) ×3 IMPLANT
DISSECTOR HYDRO NUCLEUS 50X22 (MISCELLANEOUS) ×3 IMPLANT
GLOVE BIO SURGEON STRL SZ8 (GLOVE) ×3 IMPLANT
GLOVE BIOGEL M 6.5 STRL (GLOVE) ×3 IMPLANT
GLOVE SURG LX 7.5 STRW (GLOVE) ×2
GLOVE SURG LX STRL 7.5 STRW (GLOVE) ×1 IMPLANT
GOWN STRL REUS W/ TWL LRG LVL3 (GOWN DISPOSABLE) ×2 IMPLANT
GOWN STRL REUS W/TWL LRG LVL3 (GOWN DISPOSABLE) ×4
LENS IOL TECNIS ITEC 22.0 (Intraocular Lens) ×3 IMPLANT
PACK CATARACT (MISCELLANEOUS) ×3 IMPLANT
PACK CATARACT KING (MISCELLANEOUS) ×3 IMPLANT
PACK EYE AFTER SURG (MISCELLANEOUS) ×3 IMPLANT
SOL BSS BAG (MISCELLANEOUS) ×3
SOLUTION BSS BAG (MISCELLANEOUS) ×1 IMPLANT
SYR 3ML LL SCALE MARK (SYRINGE) ×6 IMPLANT
SYR 5ML LL (SYRINGE) ×3 IMPLANT
SYR TB 1ML 27GX1/2 LL (SYRINGE) ×3 IMPLANT
WATER STERILE IRR 250ML POUR (IV SOLUTION) ×3 IMPLANT
WIPE NON LINTING 3.25X3.25 (MISCELLANEOUS) ×3 IMPLANT

## 2016-09-27 NOTE — Anesthesia Postprocedure Evaluation (Signed)
Anesthesia Post Note  Patient: Keith Cain  Procedure(s) Performed: Procedure(s) (LRB): CATARACT EXTRACTION PHACO AND INTRAOCULAR LENS PLACEMENT (IOC) (Left)  Patient location during evaluation: Short Stay Anesthesia Type: MAC Level of consciousness: awake and alert Pain management: pain level controlled Vital Signs Assessment: post-procedure vital signs reviewed and stable Respiratory status: spontaneous breathing, nonlabored ventilation, respiratory function stable and patient connected to nasal cannula oxygen Cardiovascular status: blood pressure returned to baseline and stable Postop Assessment: no signs of nausea or vomiting Anesthetic complications: no     Last Vitals:  Vitals:   09/27/16 0944 09/27/16 0947  BP: (!) 154/62 (!) 154/62  Pulse: (!) 50 (!) 48  Resp: 16 18  Temp: 36.6 C 36.6 C    Last Pain:  Vitals:   09/27/16 0734  TempSrc: Oral                 Brantley Fling

## 2016-09-27 NOTE — Op Note (Signed)
OPERATIVE NOTE  Keith Cain [id number] 09/27/2016   PREOPERATIVE DIAGNOSIS:  Nuclear sclerotic cataract left eye.  H25.12   POSTOPERATIVE DIAGNOSIS:    Nuclear sclerotic cataract left eye.     PROCEDURE:  Phacoemusification with posterior chamber intraocular lens placement of the left eye   LENS:   Implant Name Type Inv. Item Serial No. Manufacturer Lot No. LRB No. Used  LENS IOL DIOP 22.0 - [id number] Intraocular Lens LENS IOL DIOP 22.0 [id number] AMO   Left 1       [id number] +22.0   ULTRASOUND TIME: 0 minutes 54.9 seconds.  CDE 7.36   SURGEON:  Benay Pillow, MD, MPH   ANESTHESIA:  Topical with tetracaine drops augmented with 1% preservative-free intracameral lidocaine.  ESTIMATED BLOOD LOSS: <1 mL   COMPLICATIONS:  None.   DESCRIPTION OF PROCEDURE:  The patient was identified in the holding room and transported to the operating room and placed in the supine position under the operating microscope.  The left eye was identified as the operative eye and it was prepped and draped in the usual sterile ophthalmic fashion.   A 1.0 millimeter clear-corneal paracentesis was made at the 5:00 position. 0.5 ml of preservative-free 1% lidocaine with epinephrine was injected into the anterior chamber.  The anterior chamber was filled with Healon 5 viscoelastic.  A 2.4 millimeter keratome was used to make a near-clear corneal incision at the 2:00 position.  A curvilinear capsulorrhexis was made with a cystotome and capsulorrhexis forceps.  Balanced salt solution was used to hydrodissect and hydrodelineate the nucleus.   Phacoemulsification was then used in stop and chop fashion to remove the lens nucleus and epinucleus.  The remaining cortex was then removed using the irrigation and aspiration handpiece. Healon was then placed into the capsular bag to distend it for lens placement.  A lens was then injected into the capsular bag.  The remaining viscoelastic was aspirated.   Wounds were  hydrated with balanced salt solution.  The anterior chamber was inflated to a physiologic pressure with balanced salt solution.  Intracameral vigamox 0.1 mL undiltued was injected into the eye and a drop placed onto the ocular surface.  No wound leaks were noted.  The patient was taken to the recovery room in stable condition without complications of anesthesia or surgery  Benay Pillow 09/27/2016, 9:45 AM

## 2016-09-27 NOTE — H&P (Signed)
The History and Physical notes are on paper, have been signed, and are to be scanned.   I have examined the patient and there are no changes to the H&P.   Benay Pillow 09/27/2016 9:01 AM

## 2016-09-27 NOTE — Anesthesia Preprocedure Evaluation (Addendum)
Anesthesia Evaluation  Patient identified by MRN, date of birth, ID band Patient awake    Reviewed: Allergy & Precautions, NPO status , Patient's Chart, lab work & pertinent test results, reviewed documented beta blocker date and time   Airway Mallampati: III  TM Distance: >3 FB     Dental  (+) Upper Dentures, Lower Dentures   Pulmonary asthma , sleep apnea and Continuous Positive Airway Pressure Ventilation , former smoker,           Cardiovascular hypertension, Pt. on medications and Pt. on home beta blockers      Neuro/Psych    GI/Hepatic   Endo/Other  diabetes, Type 2  Renal/GU Renal disease     Musculoskeletal  (+) Arthritis ,   Abdominal   Peds  Hematology   Anesthesia Other Findings Obese. Allergic to versed.  Reproductive/Obstetrics                            Anesthesia Physical Anesthesia Plan  ASA: III  Anesthesia Plan: MAC   Post-op Pain Management:    Induction:   Airway Management Planned:   Additional Equipment:   Intra-op Plan:   Post-operative Plan:   Informed Consent: I have reviewed the patients History and Physical, chart, labs and discussed the procedure including the risks, benefits and alternatives for the proposed anesthesia with the patient or authorized representative who has indicated his/her understanding and acceptance.     Plan Discussed with: CRNA  Anesthesia Plan Comments:         Anesthesia Quick Evaluation

## 2016-09-27 NOTE — Discharge Instructions (Signed)
Eye Surgery Discharge Instructions  Expect mild scratchy sensation or mild soreness. DO NOT RUB YOUR EYE!  The day of surgery:  Minimal physical activity, but bed rest is not required  No reading, computer work, or close hand work  No bending, lifting, or straining.  May watch TV  For 24 hours:  No driving, legal decisions, or alcoholic beverages  Safety precautions  Eat anything you prefer: It is better to start with liquids, then soup then solid foods.  _____ Eye patch should be worn until postoperative exam tomorrow.  ____ Solar shield eyeglasses should be worn for comfort in the sunlight/patch while sleeping  Resume all regular medications including aspirin or Coumadin if these were discontinued prior to surgery. You may shower, bathe, shave, or wash your hair. Tylenol may be taken for mild discomfort.  Call your doctor if you experience significant pain, nausea, or vomiting, fever > 101 or other signs of infection. [phone] or [phone] Specific instructions:  Follow-up Information    Benay Pillow, MD Follow up.   Specialty:  Ophthalmology Why:  April 6 at 9:35am Contact information: [address] [place] [address] [phone]

## 2016-09-27 NOTE — Transfer of Care (Signed)
Immediate Anesthesia Transfer of Care Note  Patient: Keith Cain  Procedure(s) Performed: Procedure(s) with comments: CATARACT EXTRACTION PHACO AND INTRAOCULAR LENS PLACEMENT (IOC) (Left) - [place] 54.9 AP% 13.4 CDE 7.36 Fluid pack lot # [id number]  Patient Location: Short Stay  Anesthesia Type:MAC  Level of Consciousness: awake, alert  and oriented  Airway & Oxygen Therapy: Patient Spontanous Breathing  Post-op Assessment: Report given to RN and Post -op Vital signs reviewed and stable  Post vital signs: Reviewed  Last Vitals:  Vitals:   09/27/16 0944 09/27/16 0947  BP: (!) 154/62 (!) 154/62  Pulse: (!) 50 (!) 48  Resp: 16 18  Temp: 36.6 C 36.6 C    Last Pain:  Vitals:   09/27/16 0734  TempSrc: Oral         Complications: No apparent anesthesia complications

## 2016-09-27 NOTE — Anesthesia Post-op Follow-up Note (Cosign Needed)
Anesthesia QCDR form completed.        

## 2017-10-18 ENCOUNTER — Emergency Department (HOSPITAL_COMMUNITY)
Admission: EM | Admit: 2017-10-18 | Discharge: 2017-10-18 | Disposition: A | Discharge status: Home / Self Care | Payer: Medicare Other | Attending: Emergency Medicine | Admitting: Emergency Medicine

## 2017-10-18 ENCOUNTER — Emergency Department (HOSPITAL_COMMUNITY): Payer: Medicare Other

## 2017-10-18 ENCOUNTER — Other Ambulatory Visit: Payer: Self-pay

## 2017-10-18 ENCOUNTER — Encounter (HOSPITAL_COMMUNITY): Payer: Self-pay | Admitting: Emergency Medicine

## 2017-10-18 DIAGNOSIS — N390 Urinary tract infection, site not specified: Secondary | ICD-10-CM

## 2017-10-18 DIAGNOSIS — R310 Gross hematuria: Secondary | ICD-10-CM | POA: Diagnosis not present

## 2017-10-18 LAB — URINALYSIS, ROUTINE W REFLEX MICROSCOPIC
BILIRUBIN URINE: NEGATIVE
Glucose, UA: NEGATIVE mg/dL
KETONES UR: NEGATIVE mg/dL
LEUKOCYTES UA: NEGATIVE
Nitrite: NEGATIVE
PROTEIN: 30 mg/dL — AB
RBC / HPF: >50 RBC/hpf — ABNORMAL HIGH (ref 0–5)
Specific Gravity, Urine: 1.005 (ref 1.005–1.030)
WBC, UA: >50 WBC/hpf — ABNORMAL HIGH (ref 0–5)
pH: 6 (ref 5.0–8.0)

## 2017-10-18 LAB — CBC
HCT: 39.3 % (ref 39.0–52.0)
HEMOGLOBIN: 12.9 g/dL — AB (ref 13.0–17.0)
MCH: 29 pg (ref 26.0–34.0)
MCHC: 32.8 g/dL (ref 30.0–36.0)
MCV: 88.3 fL (ref 78.0–100.0)
Platelets: 196 10*3/uL (ref 150–400)
RBC: 4.45 MIL/uL (ref 4.22–5.81)
RDW: 14.1 % (ref 11.5–15.5)
WBC: 7.3 10*3/uL (ref 4.0–10.5)

## 2017-10-18 LAB — BASIC METABOLIC PANEL
ANION GAP: 12 (ref 5–15)
BUN: 21 mg/dL — ABNORMAL HIGH (ref 6–20)
CHLORIDE: 105 mmol/L (ref 101–111)
CO2: 21 mmol/L — ABNORMAL LOW (ref 22–32)
Calcium: 9.3 mg/dL (ref 8.9–10.3)
Creatinine, Ser: 1.1 mg/dL (ref 0.61–1.24)
GFR calc non Af Amer: >60 mL/min (ref >60)
Glucose, Bld: 166 mg/dL — ABNORMAL HIGH (ref 65–99)
Potassium: 4.3 mmol/L (ref 3.5–5.1)
SODIUM: 138 mmol/L (ref 135–145)

## 2017-10-18 MED ORDER — CEPHALEXIN 500 MG PO CAPS: 500.0000 mg | ORAL_CAPSULE | Freq: Three times a day (TID) | ORAL | 0 refills | Status: DC

## 2017-10-18 MED ORDER — CEPHALEXIN 500 MG PO CAPS
500.0000 mg | ORAL_CAPSULE | Freq: Once | ORAL | Status: AC
  Administered 2017-10-18: 500 mg via ORAL
  Filled 2017-10-18: qty 1

## 2017-10-18 NOTE — Discharge Instructions (Addendum)
Drink plenty of fluids. Return if you develop a high fever, if you start vomiting, or if you are unable to urinate.

## 2017-10-18 NOTE — ED Provider Notes (Addendum)
[facility] DEPT Provider Note   CSN: [id number] Arrival date & time: 10/18/17  0114     History   Chief Complaint Chief Complaint  Patient presents with  . Hematuria  . Back Pain    HPI Keith Cain is a 84 y.o. male.  The history is provided by the patient.  He has history of asthma, melanoma, chronic kidney disease, diabetes, hypertension, peripheral edema, prostatic hypertrophy, lung nodules and comes in because of hematuria which started tonight.  He did notice one clot in the commode.  He denies urinary urgency, frequency, tenesmus, dysuria.  He is having some pain in his lower back across the beltline, and states this feels like he pulled a muscle.  He does have chronic back pain and has had prior back surgery.  Back pain is only present if he moves, and he rates it at 7/10 if he does try to move.  He denies any pain radiating into his legs or into the flanks.  He denies weakness, numbness, tingling.  Past Medical History:  Diagnosis Date  . Arthritis    RA  . Asthma   . Cancer ([facility])    melanoma  . Chronic kidney disease   . Complication of anesthesia    HARD TO WAKE UP PATIENT STATES PCP SAID NO IV SEDATION/ HIGH RISK SINCE HARD TO WAKE UP  . Diabetes mellitus without complication ([facility])   . Edema   . Hypertension   . Lung nodules   . Oxygen deficiency    WEARS HS  . Prostate disorder   . Sleep apnea    CPAP    There are no active problems to display for this patient.   Past Surgical History:  Procedure Laterality Date  . APPENDECTOMY    . CATARACT EXTRACTION W/PHACO Right 08/30/2016   Procedure: CATARACT EXTRACTION PHACO AND INTRAOCULAR LENS PLACEMENT (IOC);  Surgeon: Eulogio Bear, MD;  Location: [facility] ORS;  Service: Ophthalmology;  Laterality: Right;  Lot# [id number] H [place]: 00:55.5 AP%: 11.0 CDE: 6.08  . CATARACT EXTRACTION W/PHACO Left 09/27/2016   Procedure: CATARACT EXTRACTION PHACO AND INTRAOCULAR LENS PLACEMENT (IOC);   Surgeon: Eulogio Bear, MD;  Location: [facility] ORS;  Service: Ophthalmology;  Laterality: Left;  [place] 54.9 AP% 13.4 CDE 7.36 Fluid pack lot # [id number]  . FOREARM SURGERY     RIGHT  . JOINT REPLACEMENT     TH X 3  . RCR          Home Medications    Prior to Admission medications   Medication Sig Start Date End Date Taking? Authorizing Provider  acetaminophen (TYLENOL) 500 MG tablet Take 500 mg by mouth daily as needed for moderate pain or headache.    [provider]  amLODipine (NORVASC) 5 MG tablet Take 5 mg by mouth 2 (two) times daily.    [provider]  aspirin EC 81 MG tablet Take 81 mg by mouth daily.    [provider]  atorvastatin (LIPITOR) 10 MG tablet Take 10 mg by mouth at bedtime.     [provider]  Difluprednate (DUREZOL) 0.05 % EMUL Place 1 drop into the right eye 2 (two) times daily.    [provider]  EPINEPHrine (EPIPEN JR) 0.15 MG/0.3ML injection Inject 0.15 mg into the skin as needed. 07/05/16   [provider]  furosemide (LASIX) 20 MG tablet Take 20 mg by mouth daily. May take an extra 20mg s as needed for swelling  [provider]  glimepiride (AMARYL) 1 MG tablet Take 1 mg by mouth daily.     [provider]  losartan (COZAAR) 100 MG tablet Take 100 mg by mouth daily.    [provider]  metFORMIN (GLUCOPHAGE) 500 MG tablet Take 1,000 mg by mouth 2 (two) times daily with a meal.    [provider]  metoprolol succinate (TOPROL-XL) 50 MG 24 hr tablet Take 50 mg by mouth daily.     [provider]  tamsulosin (FLOMAX) 0.4 MG CAPS capsule Take 0.4 mg by mouth daily.     [provider]  vitamin B-12 (CYANOCOBALAMIN) 1000 MCG tablet Take 1,000 mcg by mouth daily.    [provider]    Family History History reviewed. No pertinent family history.  Social History Social History   Tobacco Use  . Smoking status: Former Research scientist (life sciences)  . Smokeless  tobacco: Former Network engineer Use Topics  . Alcohol use: No  . Drug use: Not on file     Allergies   Codeine; Morphine and related; Percocet [oxycodone-acetaminophen]; Versed [midazolam]; Vicodin [hydrocodone-acetaminophen]; and Ambien [zolpidem tartrate]   Review of Systems Review of Systems  All other systems reviewed and are negative.    Physical Exam Updated Vital Signs BP (!) 157/61 (BP Location: Right Arm)   Pulse 60   Temp (!) 97.5 F (36.4 C) (Oral)   Resp 18   Ht 5\' 9"  (1.753 m)   Wt 136.1 kg (300 lb)   SpO2 96%   BMI 44.30 kg/m   Physical Exam  Nursing note and vitals reviewed.  84 year old male, resting comfortably and in no acute distress. Vital signs are significant for elevated systolic blood pressure. Oxygen saturation is 96%, which is normal. Head is normocephalic and atraumatic. PERRLA, EOMI. Oropharynx is clear. Neck is nontender and supple without adenopathy or JVD. Back is nontender and there is no CVA tenderness.  Straight leg raise is negative bilaterally. Lungs are clear without rales, wheezes, or rhonchi. Chest is nontender. Heart has regular rate and rhythm without murmur. Abdomen is soft, flat, nontender without masses or hepatosplenomegaly and peristalsis is normoactive. Extremities have 2+ edema, full range of motion is present. Skin is warm and dry without rash. Neurologic: Mental status is normal, cranial nerves are intact, there are no motor or sensory deficits.  ED Treatments / Results  Labs (all labs ordered are listed, but only abnormal results are displayed) Labs Reviewed  URINALYSIS, ROUTINE W REFLEX MICROSCOPIC - Abnormal; Notable for the following components:      Result Value   Color, Urine RED (*)    APPearance CLOUDY (*)    Glucose, UA   (*)    Value: TEST NOT REPORTED DUE TO COLOR INTERFERENCE OF URINE PIGMENT   Hgb urine dipstick   (*)    Value: TEST NOT REPORTED DUE TO COLOR INTERFERENCE OF URINE PIGMENT    Bilirubin Urine   (*)    Value: TEST NOT REPORTED DUE TO COLOR INTERFERENCE OF URINE PIGMENT   Ketones, ur   (*)    Value: TEST NOT REPORTED DUE TO COLOR INTERFERENCE OF URINE PIGMENT   Protein, ur   (*)    Value: TEST NOT REPORTED DUE TO COLOR INTERFERENCE OF URINE PIGMENT   Nitrite   (*)    Value: TEST NOT REPORTED DUE TO COLOR INTERFERENCE OF URINE PIGMENT   Leukocytes, UA   (*)    Value: TEST NOT REPORTED DUE TO  COLOR INTERFERENCE OF URINE PIGMENT   RBC / HPF >50 (*)    WBC, UA >50 (*)    Bacteria, UA FEW (*)    All other components within normal limits  BASIC METABOLIC PANEL - Abnormal; Notable for the following components:   CO2 21 (*)    Glucose, Bld 166 (*)    BUN 21 (*)    All other components within normal limits  CBC - Abnormal; Notable for the following components:   Hemoglobin 12.9 (*)    All other components within normal limits  URINALYSIS, ROUTINE W REFLEX MICROSCOPIC - Abnormal; Notable for the following components:   Hgb urine dipstick LARGE (*)    Protein, ur 30 (*)    Bacteria, UA RARE (*)    All other components within normal limits  URINE CULTURE  URINE CULTURE    Radiology Ct Renal Stone Study  Result Date: 10/18/2017 CLINICAL DATA:  Gross hematuria beginning at 2300 hours yesterday. Low back pain for 1 week. History of kidney disease, melanoma, appendectomy. EXAM: CT ABDOMEN AND PELVIS WITHOUT CONTRAST TECHNIQUE: Multidetector CT imaging of the abdomen and pelvis was performed following the standard protocol without IV contrast. COMPARISON:  CT abdomen and pelvis May 02, 2009 FINDINGS: LOWER CHEST: Stable 5 mm RIGHT lower lobe, 3 mm RIGHT middle lobe pulmonary nodules. Heart size is upper limits of normal. No pericardial effusion. HEPATOBILIARY: Normal. PANCREAS: Normal. SPLEEN: Punctate calcified splenic granulomas. ADRENALS/URINARY TRACT: Kidneys are orthotopic, demonstrating normal size and morphology. Punctate RIGHT interpolar nephrolithiasis. Limited  assessment for renal masses on this nonenhanced examination. The unopacified ureters are normal in course and caliber. Mild retroperitoneal and LEFT periureteral fat stranding. Urinary bladder is partially distended foot and predominately obscured by streak artifact. Normal adrenal glands. STOMACH/BOWEL: The stomach, small and large bowel are normal in course and caliber without inflammatory changes, sensitivity decreased by lack of enteric contrast. Severe descending and proximal sigmoid colonic diverticulosis. Normal appendix. VASCULAR/LYMPHATIC: Aortoiliac vessels are normal in course and caliber. Moderate calcific atherosclerosis. No lymphadenopathy by CT size criteria. REPRODUCTIVE: Mild suspected prostatomegaly though predominately obscured by hip hardware. OTHER: No intraperitoneal free fluid or free air. MUSCULOSKELETAL: Non-acute. Small RIGHT and moderate LEFT fat containing inguinal hernias. Streak artifact from bilateral hip total arthroplasties. Status post LEFT L5-S1 hemilaminectomy. Advanced degenerative change of the lumbar spine. IMPRESSION: 1. Punctate nonobstructing RIGHT nephrolithiasis. 2. Mild LEFT periureteral and retroperitoneal fat stranding concerning for urinary tract infection. Aortic Atherosclerosis (ICD10-I70.0). Electronically Signed   By: Elon Alas M.D.   On: 10/18/2017 06:20    Procedures Procedures   Medications Ordered in ED Medications  cephALEXin (KEFLEX) capsule 500 mg (has no administration in time range)     Initial Impression / Assessment and Plan / ED Course  I have reviewed the triage vital signs and the nursing notes.  Pertinent labs & imaging results that were available during my care of the patient were reviewed by me and considered in my medical decision making (see chart for details).  Painless hematuria.  Low back pain which appears to be musculoskeletal.  Because of hematuria is not clear.  Urinalysis does not show clear sign of infection, but  urine has been sent for culture.  He does have history of having had a kidney stone in 2010, but CT scan at that time showed no residual renal calculi.  Melanoma was clinically cured after being discovered in 1997, and metastatic disease is very unlikely this long after clinical cure.  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  He will be sent for renal stone protocol CT scan.  CT scan shows punctate right nephrolithiasis which is not clinically significant.  Stranding is seen around left ureter and kidney consistent with upper urinary tract infection.  This is likely the cause of his hematuria.  He has urinated while in the ED, and it does seem to be clearing.  Urine has been sent for culture.  He is discharged with prescription for cephalexin and referred to his urologist for follow-up.  Return precautions discussed.  Final Clinical Impressions(s) / ED Diagnoses   Final diagnoses:  Gross hematuria  Upper urinary tract infection    ED Discharge Orders        Ordered    cephALEXin (KEFLEX) 500 MG capsule  3 times daily     25/49/82 6415       Delora Fuel, MD 83/09/40 7680    Delora Fuel, MD 88/11/03 [phone]

## 2017-10-18 NOTE — ED Triage Notes (Signed)
Patient passed a clot in urine around 2300. Patient is having lower back pain that started a week ago. Patient states that he has had blood in his urine.

## 2017-10-19 LAB — URINE CULTURE: Culture: 60000 — AB

## 2017-10-20 ENCOUNTER — Telehealth: Payer: Self-pay

## 2017-10-20 LAB — URINE CULTURE
Culture: >=100000 — AB
SPECIAL REQUESTS: NORMAL

## 2017-10-20 NOTE — Telephone Encounter (Signed)
No action Necessary for [facility] ED 10/18/17 Per Lucio Edward Pharm D

## 2018-01-21 DIAGNOSIS — E1151 Type 2 diabetes mellitus with diabetic peripheral angiopathy without gangrene: Secondary | ICD-10-CM | POA: Insufficient documentation

## 2018-01-21 DIAGNOSIS — E1140 Type 2 diabetes mellitus with diabetic neuropathy, unspecified: Secondary | ICD-10-CM | POA: Insufficient documentation

## 2018-06-30 ENCOUNTER — Encounter: Payer: Self-pay | Admitting: Intensive Care

## 2018-06-30 ENCOUNTER — Other Ambulatory Visit: Payer: Self-pay

## 2018-06-30 ENCOUNTER — Emergency Department
Admission: EM | Admit: 2018-06-30 | Discharge: 2018-06-30 | Disposition: A | Discharge status: Home / Self Care | Payer: Medicare Other | Attending: Emergency Medicine | Admitting: Emergency Medicine

## 2018-06-30 ENCOUNTER — Emergency Department: Payer: Medicare Other

## 2018-06-30 DIAGNOSIS — Z7982 Long term (current) use of aspirin: Secondary | ICD-10-CM | POA: Diagnosis not present

## 2018-06-30 DIAGNOSIS — I129 Hypertensive chronic kidney disease with stage 1 through stage 4 chronic kidney disease, or unspecified chronic kidney disease: Secondary | ICD-10-CM | POA: Insufficient documentation

## 2018-06-30 DIAGNOSIS — M4716 Other spondylosis with myelopathy, lumbar region: Secondary | ICD-10-CM

## 2018-06-30 DIAGNOSIS — M545 Low back pain: Secondary | ICD-10-CM | POA: Diagnosis present

## 2018-06-30 DIAGNOSIS — N189 Chronic kidney disease, unspecified: Secondary | ICD-10-CM | POA: Diagnosis not present

## 2018-06-30 DIAGNOSIS — Z85820 Personal history of malignant melanoma of skin: Secondary | ICD-10-CM | POA: Diagnosis not present

## 2018-06-30 DIAGNOSIS — Z87891 Personal history of nicotine dependence: Secondary | ICD-10-CM | POA: Diagnosis not present

## 2018-06-30 DIAGNOSIS — E1122 Type 2 diabetes mellitus with diabetic chronic kidney disease: Secondary | ICD-10-CM | POA: Diagnosis not present

## 2018-06-30 DIAGNOSIS — Z79899 Other long term (current) drug therapy: Secondary | ICD-10-CM | POA: Diagnosis not present

## 2018-06-30 DIAGNOSIS — J45909 Unspecified asthma, uncomplicated: Secondary | ICD-10-CM | POA: Insufficient documentation

## 2018-06-30 MED ORDER — TRAMADOL HCL 50 MG PO TABS
50.0000 mg | ORAL_TABLET | Freq: Once | ORAL | Status: AC
  Administered 2018-06-30: 50 mg via ORAL
  Filled 2018-06-30: qty 1

## 2018-06-30 MED ORDER — LIDOCAINE 5 % EX PTCH: 1.0000 | MEDICATED_PATCH | Freq: Two times a day (BID) | CUTANEOUS | 0 refills | Status: DC

## 2018-06-30 MED ORDER — TRAMADOL HCL 50 MG PO TABS: 50.0000 mg | ORAL_TABLET | Freq: Two times a day (BID) | ORAL | 0 refills | Status: DC | PRN

## 2018-06-30 MED ORDER — LIDOCAINE 5 % EX PTCH
1.0000 | MEDICATED_PATCH | CUTANEOUS | Status: DC
  Administered 2018-06-30: 1 via TRANSDERMAL
  Filled 2018-06-30: qty 1

## 2018-06-30 NOTE — Discharge Instructions (Signed)
Advised to follow-up with orthopedic clinic for definitive evaluation and treatment.

## 2018-06-30 NOTE — ED Notes (Signed)
See triage note  Presents with lower back pain which started about 3 days ago  States pain started on right and radiates across lower back   Denies any urinary sx' or trauma

## 2018-06-30 NOTE — ED Triage Notes (Signed)
Patient c/o lower back pain Xcouple days. Denies injury.

## 2018-06-30 NOTE — ED Provider Notes (Signed)
[facility] Emergency Department Provider Note   ____________________________________________   First MD Initiated Contact with Patient 06/30/18 [phone]     (approximate)  I have reviewed the triage vital signs and the nursing notes.   HISTORY  Chief Complaint Back Pain (lower)    HPI Keith Cain is a 85 y.o. male patient presents with 3 days of increasing low back pain.  Patient denies radicular component to back pain.  Patient denies bladder bowel dysfunction.  Patient denies provoking incident for complaint.  Patient rates the pain as a 10/10.  Patient the pain increased with ambulation and prolonged standing.  Past Medical History:  Diagnosis Date  . Arthritis    RA  . Asthma   . Cancer ([place])    melanoma  . Chronic kidney disease   . Complication of anesthesia    HARD TO WAKE UP PATIENT STATES PCP SAID NO IV SEDATION/ HIGH RISK SINCE HARD TO WAKE UP  . Diabetes mellitus without complication ([place])   . Edema   . Hypertension   . Lung nodules   . Oxygen deficiency    WEARS HS  . Prostate disorder   . Sleep apnea    CPAP    There are no active problems to display for this patient.   Past Surgical History:  Procedure Laterality Date  . APPENDECTOMY    . CATARACT EXTRACTION W/PHACO Right 08/30/2016   Procedure: CATARACT EXTRACTION PHACO AND INTRAOCULAR LENS PLACEMENT (IOC);  Surgeon: Eulogio Bear, MD;  Location: [facility] ORS;  Service: Ophthalmology;  Laterality: Right;  Lot# [id number] H [place]: 00:55.5 AP%: 11.0 CDE: 6.08  . CATARACT EXTRACTION W/PHACO Left 09/27/2016   Procedure: CATARACT EXTRACTION PHACO AND INTRAOCULAR LENS PLACEMENT (IOC);  Surgeon: Eulogio Bear, MD;  Location: [facility] ORS;  Service: Ophthalmology;  Laterality: Left;  [place] 54.9 AP% 13.4 CDE 7.36 Fluid pack lot # [id number]  . FOREARM SURGERY     RIGHT  . JOINT REPLACEMENT     TH X 3  . RCR      Prior to Admission medications   Medication Sig Start Date End Date  Taking? Authorizing Provider  acetaminophen (TYLENOL) 500 MG tablet Take 500 mg by mouth daily as needed for moderate pain or headache.    [provider]  amLODipine (NORVASC) 5 MG tablet Take 5 mg by mouth 2 (two) times daily.    [provider]  aspirin EC 81 MG tablet Take 81 mg by mouth daily.    [provider]  atorvastatin (LIPITOR) 10 MG tablet Take 10 mg by mouth at bedtime.     [provider]  cephALEXin (KEFLEX) 500 MG capsule Take 1 capsule (500 mg total) by mouth 3 (three) times daily. 2/94/76   Delora Fuel, MD  Difluprednate (DUREZOL) 0.05 % EMUL Place 1 drop into the right eye 2 (two) times daily.    [provider]  EPINEPHrine (EPIPEN JR) 0.15 MG/0.3ML injection Inject 0.15 mg into the skin as needed. 07/05/16   [provider]  furosemide (LASIX) 20 MG tablet Take 20 mg by mouth daily. May take an extra 20mg s as needed for swelling    [provider]  glimepiride (AMARYL) 1 MG tablet Take 1 mg by mouth daily.     [provider]  lidocaine (LIDODERM) 5 % Place 1 patch onto the skin every 12 (twelve) hours. Remove & Discard patch within 12 hours or as directed by MD 06/30/18 06/30/19  Mardee Postin  K, PA-C  losartan (COZAAR) 100 MG tablet Take 100 mg by mouth daily.    [provider]  metFORMIN (GLUCOPHAGE) 500 MG tablet Take 1,000 mg by mouth 2 (two) times daily with a meal.    [provider]  metoprolol succinate (TOPROL-XL) 50 MG 24 hr tablet Take 50 mg by mouth daily.     [provider]  tamsulosin (FLOMAX) 0.4 MG CAPS capsule Take 0.4 mg by mouth daily.     [provider]  traMADol (ULTRAM) 50 MG tablet Take 1 tablet (50 mg total) by mouth every 12 (twelve) hours as needed. 06/30/18   Sable Feil, PA-C  vitamin B-12 (CYANOCOBALAMIN) 1000 MCG tablet Take 1,000 mcg by mouth daily.    [provider]    Allergies Codeine; Morphine and related; Percocet  [oxycodone-acetaminophen]; Versed [midazolam]; Vicodin [hydrocodone-acetaminophen]; and Ambien [zolpidem tartrate]  History reviewed. No pertinent family history.  Social History Social History   Tobacco Use  . Smoking status: Former Smoker    Last attempt to quit: 1960    Years since quitting: 60.0  . Smokeless tobacco: Former Network engineer Use Topics  . Alcohol use: No  . Drug use: Never    Review of Systems  Constitutional: No fever/chills Eyes: No visual changes. ENT: No sore throat. Cardiovascular: Denies chest pain. Respiratory: Denies shortness of breath. Gastrointestinal: No abdominal pain.  No nausea, no vomiting.  No diarrhea.  No constipation. Genitourinary: Negative for dysuria. Musculoskeletal: Positive for back pain. Skin: Negative for rash. Neurological: Negative for headaches, focal weakness or numbness. Endocrine:Diabetes and hypertension Allergic/Immunilogical: Codeine products and Ambien. ____________________________________________   PHYSICAL EXAM:  VITAL SIGNS: ED Triage Vitals  Enc Vitals Group     BP 06/30/18 0856 (!) 166/63     Pulse Rate 06/30/18 0856 69     Resp 06/30/18 0856 18     Temp 06/30/18 0856 97.7 F (36.5 C)     Temp Source 06/30/18 0856 Oral     SpO2 06/30/18 0856 96 %     Weight 06/30/18 0853 300 lb (136.1 kg)     Height 06/30/18 0853 5\' 8"  (1.727 m)     Head Circumference --      Peak Flow --      Pain Score 06/30/18 0853 10     Pain Loc --      Pain Edu? --      Excl. in [place]? --     Constitutional: Alert and oriented. Well appearing and in no acute distress.  Overweight. Neck: No stridor. Hematological/Lymphatic/Immunilogical: No cervical lymphadenopathy. Cardiovascular: Normal rate, regular rhythm. Grossly normal heart sounds.  Good peripheral circulation. Respiratory: Normal respiratory effort.  No retractions. Lungs CTAB. Musculoskeletal: No obvious spinal deformity.  Patient patient sits and stands with heavy  reliance on upper extremities.  Patient uses a cane to assist in ambulation.  Patient is moderate guarding palpation L4-S1. Neurologic:  Normal speech and language. No gross focal neurologic deficits are appreciated. No gait instability. Skin:  Skin is warm, dry and intact. No rash noted. Psychiatric: Mood and affect are normal. Speech and behavior are normal.  ____________________________________________   LABS (all labs ordered are listed, but only abnormal results are displayed)  Labs Reviewed - No data to display ____________________________________________  EKG   ____________________________________________  RADIOLOGY  ED MD interpretation:    Official radiology report(s): Dg Lumbar Spine 2-3 Views  Result Date: 06/30/2018 CLINICAL DATA:  85 year old male complaining of back pain for couple  days. Denies injury. Initial encounter. EXAM: LUMBAR SPINE - 2-3 VIEW COMPARISON:  10/18/2017 CT. FINDINGS: Transitional L5 vertebral body with rudimentary disc at the L5-S1 level. Small Schmorl's node deformity and mild loss of height L4-5 disc space level. Minimal anterior slip L3 secondary to facet degenerative changes. Mild L3-4 disc space narrowing. Mild degenerative changes lower thoracic spine. Aortic calcification. Prior hip replacements. IMPRESSION: 1. Transitional L5 vertebral body with rudimentary disc at the L5-S1 level. 2. Small Schmorl's node deformity and mild loss of height L4-5 disc space level. 3. Minimal anterior slip L3 secondary to facet degenerative changes. Mild L3-4 disc space narrowing. 4. Mild degenerative changes lower thoracic spine. 5.  Aortic Atherosclerosis (ICD10-I70.0). Electronically Signed   By: Genia Del M.D.   On: 06/30/2018 10:36    ____________________________________________   PROCEDURES  Procedure(s) performed: None  Procedures  Critical Care performed: No  ____________________________________________   INITIAL IMPRESSION / ASSESSMENT AND  PLAN / ED COURSE  As part of my medical decision making, I reviewed the following data within the [place]   Patient presents with increasing low back pain.  No provocative incident for complaint.  Patient has long history of degenerative disc disease of lumbar spine.  X-ray reveals multiple levels of disc space narrowing.  Discussed x-ray findings with patient.  Patient given discharge care instruction advised follow orthopedic for definitive evaluation and treatment.   ____________________________________________   FINAL CLINICAL IMPRESSION(S) / ED DIAGNOSES  Final diagnoses:  Osteoarthritis of lumbar spine with myelopathy     ED Discharge Orders         Ordered    lidocaine (LIDODERM) 5 %  Every 12 hours     06/30/18 1116    traMADol (ULTRAM) 50 MG tablet  Every 12 hours PRN     06/30/18 1116           Note:  This document was prepared using Dragon voice recognition software and may include unintentional dictation errors.    Sable Feil, PA-C 06/30/18 1121    Arta Silence, MD 07/01/18 [phone]

## 2018-07-01 DIAGNOSIS — M1A00X0 Idiopathic chronic gout, unspecified site, without tophus (tophi): Secondary | ICD-10-CM | POA: Insufficient documentation

## 2018-07-05 ENCOUNTER — Encounter: Payer: Self-pay | Admitting: Emergency Medicine

## 2018-07-05 ENCOUNTER — Emergency Department
Admission: EM | Admit: 2018-07-05 | Discharge: 2018-07-05 | Disposition: A | Discharge status: Home / Self Care | Payer: Medicare Other | Attending: Emergency Medicine | Admitting: Emergency Medicine

## 2018-07-05 ENCOUNTER — Emergency Department: Payer: Medicare Other

## 2018-07-05 DIAGNOSIS — M109 Gout, unspecified: Secondary | ICD-10-CM | POA: Diagnosis not present

## 2018-07-05 DIAGNOSIS — Z7984 Long term (current) use of oral hypoglycemic drugs: Secondary | ICD-10-CM | POA: Insufficient documentation

## 2018-07-05 DIAGNOSIS — R310 Gross hematuria: Secondary | ICD-10-CM | POA: Diagnosis not present

## 2018-07-05 DIAGNOSIS — Z79899 Other long term (current) drug therapy: Secondary | ICD-10-CM | POA: Insufficient documentation

## 2018-07-05 DIAGNOSIS — N3289 Other specified disorders of bladder: Secondary | ICD-10-CM

## 2018-07-05 DIAGNOSIS — N189 Chronic kidney disease, unspecified: Secondary | ICD-10-CM | POA: Insufficient documentation

## 2018-07-05 DIAGNOSIS — E1122 Type 2 diabetes mellitus with diabetic chronic kidney disease: Secondary | ICD-10-CM | POA: Diagnosis not present

## 2018-07-05 DIAGNOSIS — Z87891 Personal history of nicotine dependence: Secondary | ICD-10-CM | POA: Diagnosis not present

## 2018-07-05 DIAGNOSIS — Z7982 Long term (current) use of aspirin: Secondary | ICD-10-CM | POA: Diagnosis not present

## 2018-07-05 DIAGNOSIS — M069 Rheumatoid arthritis, unspecified: Secondary | ICD-10-CM | POA: Insufficient documentation

## 2018-07-05 DIAGNOSIS — R319 Hematuria, unspecified: Secondary | ICD-10-CM | POA: Diagnosis present

## 2018-07-05 DIAGNOSIS — J45909 Unspecified asthma, uncomplicated: Secondary | ICD-10-CM | POA: Diagnosis not present

## 2018-07-05 LAB — CBC WITH DIFFERENTIAL/PLATELET
Abs Immature Granulocytes: 0.15 10*3/uL — ABNORMAL HIGH (ref 0.00–0.07)
Basophils Absolute: 0.1 10*3/uL (ref 0.0–0.1)
Basophils Relative: 1 %
EOS PCT: 3 %
Eosinophils Absolute: 0.4 10*3/uL (ref 0.0–0.5)
HCT: 36.6 % — ABNORMAL LOW (ref 39.0–52.0)
HEMOGLOBIN: 12.1 g/dL — AB (ref 13.0–17.0)
Immature Granulocytes: 1 %
Lymphocytes Relative: 7 %
Lymphs Abs: 1.2 10*3/uL (ref 0.7–4.0)
MCH: 29 pg (ref 26.0–34.0)
MCHC: 33.1 g/dL (ref 30.0–36.0)
MCV: 87.8 fL (ref 80.0–100.0)
Monocytes Absolute: 0.9 10*3/uL (ref 0.1–1.0)
Monocytes Relative: 6 %
Neutro Abs: 13.8 10*3/uL — ABNORMAL HIGH (ref 1.7–7.7)
Neutrophils Relative %: 82 %
Platelets: 317 10*3/uL (ref 150–400)
RBC: 4.17 MIL/uL — ABNORMAL LOW (ref 4.22–5.81)
RDW: 13.2 % (ref 11.5–15.5)
WBC: 16.5 10*3/uL — ABNORMAL HIGH (ref 4.0–10.5)
nRBC: 0 % (ref 0.0–0.2)

## 2018-07-05 LAB — BASIC METABOLIC PANEL
Anion gap: 11 (ref 5–15)
BUN: 17 mg/dL (ref 8–23)
CO2: 23 mmol/L (ref 22–32)
Calcium: 8.7 mg/dL — ABNORMAL LOW (ref 8.9–10.3)
Chloride: 99 mmol/L (ref 98–111)
Creatinine, Ser: 0.97 mg/dL (ref 0.61–1.24)
GFR calc Af Amer: >60 mL/min (ref >60)
GFR calc non Af Amer: >60 mL/min (ref >60)
Glucose, Bld: 199 mg/dL — ABNORMAL HIGH (ref 70–99)
Potassium: 4.3 mmol/L (ref 3.5–5.1)
Sodium: 133 mmol/L — ABNORMAL LOW (ref 135–145)

## 2018-07-05 LAB — URINALYSIS, COMPLETE (UACMP) WITH MICROSCOPIC
BACTERIA UA: NONE SEEN
RBC / HPF: >50 RBC/hpf — ABNORMAL HIGH (ref 0–5)
SQUAMOUS EPITHELIAL / LPF: NONE SEEN (ref 0–5)
Specific Gravity, Urine: 1.014 (ref 1.005–1.030)
WBC, UA: >50 WBC/hpf — ABNORMAL HIGH (ref 0–5)

## 2018-07-05 MED ORDER — ACETAMINOPHEN 500 MG PO TABS
1000.0000 mg | ORAL_TABLET | Freq: Once | ORAL | Status: AC
  Administered 2018-07-05: 1000 mg via ORAL
  Filled 2018-07-05: qty 2

## 2018-07-05 MED ORDER — DICLOFENAC SODIUM 1 % TD GEL: 2.0000 g | Freq: Four times a day (QID) | TRANSDERMAL | 0 refills | Status: DC

## 2018-07-05 MED ORDER — SODIUM CHLORIDE 0.9 % IV SOLN
1.0000 g | Freq: Once | INTRAVENOUS | Status: AC
  Administered 2018-07-05: 1 g via INTRAVENOUS
  Filled 2018-07-05: qty 10

## 2018-07-05 MED ORDER — COLCHICINE 0.6 MG PO TABS
0.6000 mg | ORAL_TABLET | Freq: Once | ORAL | Status: AC
  Administered 2018-07-05: 0.6 mg via ORAL
  Filled 2018-07-05: qty 1

## 2018-07-05 MED ORDER — COLCHICINE 0.6 MG PO TABS: 0.6000 mg | ORAL_TABLET | Freq: Two times a day (BID) | ORAL | 0 refills | Status: DC

## 2018-07-05 MED ORDER — COLCHICINE 0.6 MG PO TABS
0.6000 mg | ORAL_TABLET | Freq: Once | ORAL | Status: AC
  Administered 2018-07-05: 0.6 mg via ORAL
  Filled 2018-07-05 (×2): qty 1

## 2018-07-05 NOTE — Discharge Instructions (Signed)
Your CT shows a mass in your bladder that is concerning for possible cancer.  Make sure to follow-up with [facility] urology this coming week for further management.  Drink plenty of fluids.  Take your Flomax as prescribed.  Return to the emergency room if you have abdominal pain, fever, or if you are unable to urinate.  Take colchicine as prescribed for your gout flare.  Follow-up with your doctor on Monday.

## 2018-07-05 NOTE — ED Notes (Signed)
Pt passed blood clot with urination. Per MD Alfred Levins, foley catheter insertion held till reevaluation.

## 2018-07-05 NOTE — ED Notes (Signed)
Pt and Pt's family verbalized understanding of discharge instructions. NAD at this time. 

## 2018-07-05 NOTE — ED Triage Notes (Addendum)
Arrives with hematuria x 1 day and today states unable to void.  Last void 0400. Symptoms initially started with back pain and has been seen through ED.  Patient has history of kidney stones, and symptoms are similar to past episodes.  Seen by Dr. Sabra Heck earlier this week, started on Augmentin and given a steroid injection to back.

## 2018-07-05 NOTE — ED Provider Notes (Signed)
[facility] Emergency Department Provider Note  ____________________________________________  Time seen: Approximately 8:50 AM  I have reviewed the triage vital signs and the nursing notes.   HISTORY  Chief Complaint Hematuria and Dysuria   HPI Keith Cain is a 85 y.o. male with a history of BPH, hypertension, diabetes, chronic kidney disease, kidney stones, gout, melanoma who presents for evaluation of hematuria.  Patient started having hematuria yesterday.  Was seen here 5 days ago for low back pain.  Follow-up with his primary care doctor who thought patient was passing another kidney stone and put him on Augmentin.  The back pain resolved however yesterday patient started noticing blood in his urine.  This morning at 4 AM he passed large clots and reports gross hematuria.  Has been unable to urinate since then.  He is feeling pressure in his suprapubic region which is getting progressively worse through the morning.  No fever or chills, no nausea or vomiting.  Patient does endorse dysuria.  He is not on blood thinners.  No dizziness or chest pain.  Patient is also complaining of gout flare in his right elbow.  Patient has had several in the past.  Feels exactly like his prior gout flares.  Started last night.  He is complaining of severe throbbing pain that is worse with movement of the elbow.  No fever or chills.  Patient usually takes colchicine but has run out of it.   Past Medical History:  Diagnosis Date  . Arthritis    RA  . Asthma   . Cancer ([place])    melanoma  . Chronic kidney disease   . Complication of anesthesia    HARD TO WAKE UP PATIENT STATES PCP SAID NO IV SEDATION/ HIGH RISK SINCE HARD TO WAKE UP  . Diabetes mellitus without complication ([place])   . Edema   . Hypertension   . Lung nodules   . Oxygen deficiency    WEARS HS  . Prostate disorder   . Sleep apnea    CPAP    There are no active problems to display for this  patient.   Past Surgical History:  Procedure Laterality Date  . APPENDECTOMY    . CATARACT EXTRACTION W/PHACO Right 08/30/2016   Procedure: CATARACT EXTRACTION PHACO AND INTRAOCULAR LENS PLACEMENT (IOC);  Surgeon: Eulogio Bear, MD;  Location: [facility] ORS;  Service: Ophthalmology;  Laterality: Right;  Lot# [id number] H [place]: 00:55.5 AP%: 11.0 CDE: 6.08  . CATARACT EXTRACTION W/PHACO Left 09/27/2016   Procedure: CATARACT EXTRACTION PHACO AND INTRAOCULAR LENS PLACEMENT (IOC);  Surgeon: Eulogio Bear, MD;  Location: [facility] ORS;  Service: Ophthalmology;  Laterality: Left;  [place] 54.9 AP% 13.4 CDE 7.36 Fluid pack lot # [id number]  . FOREARM SURGERY     RIGHT  . JOINT REPLACEMENT     TH X 3  . RCR      Prior to Admission medications   Medication Sig Start Date End Date Taking? Authorizing Provider  acetaminophen (TYLENOL) 500 MG tablet Take 500 mg by mouth every 8 (eight) hours as needed for moderate pain or headache.    Yes [provider]  amLODipine (NORVASC) 5 MG tablet Take 5 mg by mouth 2 (two) times daily.   Yes [provider]  aspirin EC 81 MG tablet Take 81 mg by mouth daily.   Yes [provider]  atorvastatin (LIPITOR) 10 MG tablet Take 10 mg by mouth at bedtime.    Yes [provider]  EPINEPHrine (EPIPEN JR) 0.15 MG/0.3ML injection Inject 0.15 mg into the skin as needed. 07/05/16  Yes [provider]  furosemide (LASIX) 20 MG tablet Take 20 mg by mouth daily.    Yes [provider]  glimepiride (AMARYL) 1 MG tablet Take 1 mg by mouth daily.    Yes [provider]  losartan (COZAAR) 100 MG tablet Take 100 mg by mouth daily.   Yes [provider]  metFORMIN (GLUCOPHAGE) 500 MG tablet Take 1,000 mg by mouth 2 (two) times daily with a meal.   Yes [provider]  metoprolol succinate (TOPROL-XL) 50 MG 24 hr tablet Take 50 mg by mouth daily.    Yes [provider]  tamsulosin (FLOMAX) 0.4 MG CAPS  capsule Take 0.4 mg by mouth daily.    Yes [provider]  traMADol (ULTRAM) 50 MG tablet Take 1 tablet (50 mg total) by mouth every 12 (twelve) hours as needed. 06/30/18  Yes Sable Feil, PA-C  vitamin B-12 (CYANOCOBALAMIN) 1000 MCG tablet Take 1,000 mcg by mouth daily.   Yes [provider]  cephALEXin (KEFLEX) 500 MG capsule Take 1 capsule (500 mg total) by mouth 3 (three) times daily. 5/40/08   Delora Fuel, MD  colchicine 0.6 MG tablet Take 1 tablet (0.6 mg total) by mouth 2 (two) times daily for 7 days. 07/05/18 07/12/18  Rudene Re, MD  diclofenac sodium (VOLTAREN) 1 % GEL Apply 2 g topically 4 (four) times daily. Apply three times a day over the R elbow 07/05/18   Alfred Levins, [place], MD  lidocaine (LIDODERM) 5 % Place 1 patch onto the skin every 12 (twelve) hours. Remove & Discard patch within 12 hours or as directed by MD 06/30/18 06/30/19  Sable Feil, PA-C    Allergies Codeine; Morphine and related; Percocet [oxycodone-acetaminophen]; Versed [midazolam]; Vicodin [hydrocodone-acetaminophen]; and Ambien [zolpidem tartrate]  No family history on file.  Social History Social History   Tobacco Use  . Smoking status: Former Smoker    Last attempt to quit: 1960    Years since quitting: 60.0  . Smokeless tobacco: Former Network engineer Use Topics  . Alcohol use: No  . Drug use: Never    Review of Systems  Constitutional: Negative for fever. Eyes: Negative for visual changes. ENT: Negative for sore throat. Neck: No neck pain  Cardiovascular: Negative for chest pain. Respiratory: Negative for shortness of breath. Gastrointestinal: Negative for abdominal pain, vomiting or diarrhea. Genitourinary: + dysuria and hematuria Musculoskeletal: Negative for back pain. + elbow pain Skin: Negative for rash. Neurological: Negative for headaches, weakness or numbness. Psych: No SI or HI  ____________________________________________   PHYSICAL EXAM:  VITAL  SIGNS: ED Triage Vitals  Enc Vitals Group     BP 07/05/18 0808 (!) 183/63     Pulse Rate 07/05/18 0808 70     Resp 07/05/18 0808 16     Temp 07/05/18 0808 98.6 F (37 C)     Temp Source 07/05/18 0808 Oral     SpO2 07/05/18 0808 96 %     Weight 07/05/18 0809 299 lb 13.2 oz (136 kg)     Height 07/05/18 0809 5\' 8"  (1.727 m)     Head Circumference --      Peak Flow --      Pain Score 07/05/18 0809 5     Pain Loc --      Pain Edu? --      Excl. in [place]? --  Constitutional: Alert and oriented. Well appearing and in no apparent distress. HEENT:      Head: Normocephalic and atraumatic.         Eyes: Conjunctivae are normal. Sclera is non-icteric.       Mouth/Throat: Mucous membranes are moist.       Neck: Supple with no signs of meningismus. Cardiovascular: Regular rate and rhythm. No murmurs, gallops, or rubs. 2+ symmetrical distal pulses are present in all extremities. No JVD. Respiratory: Normal respiratory effort. Lungs are clear to auscultation bilaterally. No wheezes, crackles, or rhonchi.  Gastrointestinal: Obese, mild suprapubic tenderness, and non distended with positive bowel sounds. No rebound or guarding. Genitourinary: No CVA tenderness. Musculoskeletal: R elbow has swelling and warmth, no erythema, ROM ~50% preserved with pain. Neurologic: Normal speech and language. Face is symmetric. Moving all extremities. No gross focal neurologic deficits are appreciated. Skin: Skin is warm, dry and intact. No rash noted. Psychiatric: Mood and affect are normal. Speech and behavior are normal.  ____________________________________________   LABS (all labs ordered are listed, but only abnormal results are displayed)  Labs Reviewed  CBC WITH DIFFERENTIAL/PLATELET - Abnormal; Notable for the following components:      Result Value   WBC 16.5 (*)    RBC 4.17 (*)    Hemoglobin 12.1 (*)    HCT 36.6 (*)    Neutro Abs 13.8 (*)    Abs Immature Granulocytes 0.15 (*)    All other  components within normal limits  BASIC METABOLIC PANEL - Abnormal; Notable for the following components:   Sodium 133 (*)    Glucose, Bld 199 (*)    Calcium 8.7 (*)    All other components within normal limits  URINALYSIS, COMPLETE (UACMP) WITH MICROSCOPIC - Abnormal; Notable for the following components:   Color, Urine RED (*)    APPearance CLOUDY (*)    Glucose, UA   (*)    Value: TEST NOT REPORTED DUE TO COLOR INTERFERENCE OF URINE PIGMENT   Hgb urine dipstick   (*)    Value: TEST NOT REPORTED DUE TO COLOR INTERFERENCE OF URINE PIGMENT   Bilirubin Urine   (*)    Value: TEST NOT REPORTED DUE TO COLOR INTERFERENCE OF URINE PIGMENT   Ketones, ur   (*)    Value: TEST NOT REPORTED DUE TO COLOR INTERFERENCE OF URINE PIGMENT   Protein, ur   (*)    Value: TEST NOT REPORTED DUE TO COLOR INTERFERENCE OF URINE PIGMENT   Nitrite   (*)    Value: TEST NOT REPORTED DUE TO COLOR INTERFERENCE OF URINE PIGMENT   Leukocytes, UA   (*)    Value: TEST NOT REPORTED DUE TO COLOR INTERFERENCE OF URINE PIGMENT   RBC / HPF >50 (*)    WBC, UA >50 (*)    All other components within normal limits  URINE CULTURE   ____________________________________________  EKG  none  ____________________________________________  RADIOLOGY  I have personally reviewed the images performed during this visit and I agree with the Radiologist's read.   Interpretation by Radiologist:  Ct Renal Stone Study  Result Date: 07/05/2018 CLINICAL DATA:  85 year old male with hematuria, unable to void and back pain. Known history of renal stones. EXAM: CT ABDOMEN AND PELVIS WITHOUT CONTRAST TECHNIQUE: Multidetector CT imaging of the abdomen and pelvis was performed following the standard protocol without IV contrast. COMPARISON:  Prior CT scan of the abdomen and pelvis 10/18/2017 FINDINGS: Lower chest: Scattered small bilateral lower lobe pulmonary nodules, insignificantly  changed compared to prior imaging and highly likely  benign. The visualized cardiac structures are normal in size. Unremarkable distal thoracic esophagus. Hepatobiliary: Normal hepatic contour and morphology. No discrete hepatic lesions. Normal appearance of the gallbladder. No intra or extrahepatic biliary ductal dilatation. Pancreas: Mild fatty atrophy. No discrete mass or inflammatory change. Spleen: Punctate calcifications in the lower pole, likely old granulomatous disease. No acute abnormality. Adrenals/Urinary Tract: Normal adrenal glands bilaterally. Nonspecific bilateral perinephric stranding. Probable punctate stone in the interpolar right kidney. No evidence of hydronephrosis. There is mild focal dilatation of the right ureter just past the iliac crest. The distal most ureters and ureterovesicular junctions are not well seen bilaterally secondary to streak artifact from bilateral hip prostheses. The left ureter is essentially normal. Visualization of the bladder is limited by extensive streak artifact from bilateral hip prostheses. However, there appears to be a lobular high density mass along the anterior wall of the bladder which measures approximately 4.2 x 3.5 cm. Stomach/Bowel: Colonic diverticular disease without CT evidence of active inflammation. No evidence of bowel wall thickening or bowel obstruction. Vascular/Lymphatic: Limited evaluation in the absence of intravenous contrast. No evidence of aneurysm. Atherosclerotic calcifications present throughout the abdominal aorta. No suspicious lymphadenopathy. Reproductive: Prostate is unremarkable. Other: No abdominal wall hernia or abnormality. No abdominopelvic ascites. Musculoskeletal: Bilateral hip arthroplasties. Advanced lower lumbar degenerative disc disease and bilateral facet arthropathy. No acute fracture or aggressive appearing lytic or blastic osseous lesion. IMPRESSION: 1. Suspect approximately 4.2 x 3.5 cm lobular anterior bladder wall mass highly concerning for urothelial carcinoma. This  is likely source of the patient's reported hematuria. Recommend urology consultation. 2. Focal dilation of the right ureter in the pelvis just below the level of the iliac crest is of uncertain etiology and may reflect recent passage of a small stone, or perhaps another urothelial lesion. 3. Punctate nonobstructing stone in the interpolar right kidney. No evidence of hydronephrosis. 4. Nonspecific perinephric stranding bilaterally. 5. Colonic diverticular disease without CT evidence of active inflammation. 6.  Aortic Atherosclerosis (ICD10-170.0). 7. Lower lumbar degenerative disc disease and bilateral facet arthropathy. Electronically Signed   By: Jacqulynn Cadet M.D.   On: 07/05/2018 09:08      ____________________________________________   PROCEDURES  Procedure(s) performed: None Procedures Critical Care performed:  None ____________________________________________   INITIAL IMPRESSION / ASSESSMENT AND PLAN / ED COURSE   85 y.o. male with a history of BPH, hypertension, diabetes, chronic kidney disease, kidney stones, gout, melanoma who presents for evaluation of hematuria and dysuria since last night. PVR 555cc. Ddx urinary tract infection versus kidney stone versus malignancy.  Will check CBC to rule out acute blood loss anemia, BMP to rule out acute kidney injury, UA to evaluate for urinary tract infection.  Foley catheter will be placed.  We will send patient for CT renal to rule out kidney stone.   #elbow pain: Patient has had several prior episodes of gout in the same elbow.  Low suspicion for septic joint with roughly 50% range of motion preserved, no erythema, no fever.  Will treat with colchicine.   _________________________ 10:35 AM on 07/05/2018 -----------------------------------------  CT concerning for bladder mass, possible malignancy.  No ureteral stone.  UA is limited due to large amount of blood but no bacteria is seen.  Since patient has slightly elevated white count  we will treat with a dose of Rocephin.  He is currently on Augmentin, recommended continuing Augmentin starting tomorrow.  Culture has been sent.  Recommended close follow-up with  urology for biopsy of the mass is seen.  Patient passed a large clot and was able to urinate and empty his bladder with no further signs of urinary retention.  At this time we will hold off on Foley catheter placement.  Recommended return to the emergency room if patient experiences new urinary retention.  Labs showing no acute blood loss anemia normal kidney function.  Will discharge home with close follow-up with urology and PCP for the gout flare.  As part of my medical decision making, I reviewed the following data within the [place] History obtained from family, Nursing notes reviewed and incorporated, Labs reviewed , Old chart reviewed, Radiograph reviewed , Notes from prior ED visits and [place] Controlled Substance Database    Pertinent labs & imaging results that were available during my care of the patient were reviewed by me and considered in my medical decision making (see chart for details).    ____________________________________________   FINAL CLINICAL IMPRESSION(S) / ED DIAGNOSES  Final diagnoses:  Gross hematuria  Bladder mass  Acute gout of right elbow, unspecified cause      NEW MEDICATIONS STARTED DURING THIS VISIT:  ED Discharge Orders         Ordered    colchicine 0.6 MG tablet  2 times daily     07/05/18 1039    diclofenac sodium (VOLTAREN) 1 % GEL  4 times daily     07/05/18 1039           Note:  This document was prepared using Dragon voice recognition software and may include unintentional dictation errors.    Alfred Levins, [place], MD 07/05/18 1041

## 2018-07-06 LAB — URINE CULTURE: Culture: NO GROWTH

## 2018-07-08 ENCOUNTER — Encounter: Payer: Self-pay | Admitting: Urology

## 2018-07-08 ENCOUNTER — Ambulatory Visit
Admission: RE | Admit: 2018-07-08 | Discharge: 2018-07-08 | Disposition: A | Discharge status: Home / Self Care | Payer: Medicare Other | Source: Ambulatory Visit | Attending: Urology | Admitting: Urology

## 2018-07-08 ENCOUNTER — Ambulatory Visit (INDEPENDENT_AMBULATORY_CARE_PROVIDER_SITE_OTHER): Payer: Medicare Other | Admitting: Urology

## 2018-07-08 ENCOUNTER — Telehealth: Payer: Self-pay | Admitting: Radiology

## 2018-07-08 ENCOUNTER — Ambulatory Visit
Admission: RE | Admit: 2018-07-08 | Discharge: 2018-07-08 | Disposition: A | Discharge status: Home / Self Care | Payer: Medicare Other | Source: Home / Self Care | Attending: Urology | Admitting: Urology

## 2018-07-08 VITALS — BP 136/71 | HR 65 | Ht 68.0 in | Wt 300.0 lb

## 2018-07-08 DIAGNOSIS — R319 Hematuria, unspecified: Secondary | ICD-10-CM

## 2018-07-08 LAB — MICROSCOPIC EXAMINATION: RBC, UA: >30 /hpf — ABNORMAL HIGH (ref 0–2)

## 2018-07-08 LAB — URINALYSIS, COMPLETE
Bilirubin, UA: NEGATIVE
Glucose, UA: NEGATIVE
Ketones, UA: NEGATIVE
Nitrite, UA: NEGATIVE
Specific Gravity, UA: 1.015 (ref 1.005–1.030)
Urobilinogen, Ur: 0.2 mg/dL (ref 0.2–1.0)
pH, UA: 5.5 (ref 5.0–7.5)

## 2018-07-08 MED ORDER — IOPAMIDOL (ISOVUE-300) INJECTION 61%
125.0000 mL | Freq: Once | INTRAVENOUS | Status: AC | PRN
  Administered 2018-07-08: 125 mL via INTRAVENOUS

## 2018-07-08 NOTE — Progress Notes (Signed)
07/08/2018 12:50 PM   Keith Cain 10/27/1931 [id number]  Referring provider: Rusty Aus, MD [place], [place] [address]  CC: Gross hematuria  HPI: I saw Keith Cain in urology clinic today in consultation for gross hematuria.  He is an 85 year old man with a number of co-morbidities including history of melanoma, diabetes, hypertension, sleep apnea, and multiple hip replacements.  He was seen in the emergency department on 07/05/2018 with gross hematuria and clots and difficulty voiding and a CT abdomen pelvis without contrast suggested a possible 4 cm anterior wall bladder mass worrisome for malignancy.  There was also focal dilation of the right ureter in the pelvis of unclear etiology.  He denies any flank pain at this time.  He does have a history of nephrolithiasis.  He reports over a year of gross hematuria with intermittent clots.He is currently voiding pink to brown urine without clots.  He has a 30-pack-year smoking history, and quit over 40 years ago.  He previously worked as a Dealer, and denies any other carcinogenic exposures.  He denies any weight loss or bone pain.  There are no aggravating or alleviating factors.  Severity is moderate to severe.  He was previously followed by Dr. Jeffie Pollock at [facility] urology in [place] for [place], and is on Flomax.  He has a history of CKD, however recent lab work showed creatinine of 0.97 with eGFR greater than 60.   PMH: Past Medical History:  Diagnosis Date  . Arthritis    RA  . Asthma   . Cancer ([place])    melanoma  . Chronic kidney disease   . Complication of anesthesia    HARD TO WAKE UP PATIENT STATES PCP SAID NO IV SEDATION/ HIGH RISK SINCE HARD TO WAKE UP  . Diabetes mellitus without complication ([place])   . Edema   . Hypertension   . Lung nodules   . Oxygen deficiency    WEARS HS  . Prostate disorder   . Sleep apnea    CPAP    Surgical History: Past Surgical  History:  Procedure Laterality Date  . APPENDECTOMY    . CATARACT EXTRACTION W/PHACO Right 08/30/2016   Procedure: CATARACT EXTRACTION PHACO AND INTRAOCULAR LENS PLACEMENT (IOC);  Surgeon: Eulogio Bear, MD;  Location: [facility] ORS;  Service: Ophthalmology;  Laterality: Right;  Lot# [id number] H [place]: 00:55.5 AP%: 11.0 CDE: 6.08  . CATARACT EXTRACTION W/PHACO Left 09/27/2016   Procedure: CATARACT EXTRACTION PHACO AND INTRAOCULAR LENS PLACEMENT (IOC);  Surgeon: Eulogio Bear, MD;  Location: [facility] ORS;  Service: Ophthalmology;  Laterality: Left;  [place] 54.9 AP% 13.4 CDE 7.36 Fluid pack lot # [id number]  . FOREARM SURGERY     RIGHT  . JOINT REPLACEMENT     TH X 3  . RCR     Allergies:  Allergies  Allergen Reactions  . Codeine Shortness Of Breath and Rash  . Morphine And Related Shortness Of Breath and Rash  . Percocet [Oxycodone-Acetaminophen] Shortness Of Breath and Rash  . Versed [Midazolam] Anaphylaxis  . Vicodin [Hydrocodone-Acetaminophen] Shortness Of Breath and Rash  . Ambien [Zolpidem Tartrate]     Hallucinations     Family History: No family history on file.  Social History:  reports that he quit smoking about 60 years ago. He has quit using smokeless tobacco. He reports that he does not drink alcohol or use drugs.  ROS: Please see flowsheet from today's date for complete review of systems.  Physical  Exam: BP 136/71   Pulse 65   Ht 5' 8"  (1.727 m)   Wt 300 lb (136.1 kg)   BMI 45.61 kg/m    Constitutional:  Alert and oriented, No acute distress. Obese, in wheelchair Cardiovascular: No clubbing, cyanosis, or edema.  Respiratory: Normal respiratory effort, no increased work of breathing. GI: Abdomen is soft, nontender, nondistended, no abdominal masses GU: No CVA tenderness Lymph: No cervical or inguinal lymphadenopathy. Skin: No rashes, bruises or suspicious lesions. Neurologic: Grossly intact, no focal deficits, moving all 4 extremities. Psychiatric: Normal mood and  affect.  Pertinent Imaging: I have personally reviewed the CT renal stone study, possible anterior wall 4 cm bladder mass, but difficult to identify secondary to streak artifact from hip replacement.  Possible dilation versus mass of the right distal ureter.  Assessment & Plan:   In summary, the patient is an 85 year old co-morbid male with over a year of gross hematuria and CT abdomen pelvis without contrast concerning for an anterior wall bladder mass.  We discussed common possible etiologies of hematuria including BPH, malignancy, urolithiasis, medical renal disease, and idiopathic. Standard workup recommended by the AUA includes imaging with CT urogram to assess the upper tracts, and cystoscopy.   With his ongoing hematuria and suspicion for mass on noncontrast CT, I recommended completing staging imaging with a CT with contrast with delayed films to evaluate for any lymphadenopathy, and better evaluate the ureters bilaterally.  We will also obtain a chest x-ray today to complete staging.  With his history and imaging findings highly suspicious for bladder tumor and cancer, I recommended proceeding to the operating room for TURBT as opposed to proceeding with clinic cystoscopy.  CT urogram and chest x-ray, will call with results Schedule TURBT, will need PCP clearance  Billey Co, MD  [facility] [address] [place], [place] [address] [phone]

## 2018-07-08 NOTE — Telephone Encounter (Signed)
Patient was given the [facility] Information form below as well as the Instructions for Pre-Admission Testing form & a map of [facility].   [place], [place], [place] [address] Telephone: [phone] Fax: [phone]   Thank you for choosing [facility] for your upcoming surgery!  We are always here to assist in your urological needs.  Please read the following information with specific details for your upcoming appointments related to your surgery. Please contact [facility] at [phone] Option 3 with any questions.  The Name of Your Surgery: Transurethral resection of bladder mass Your Surgery Date: 07/25/2018 Your Surgeon: Nickolas Madrid  Please call Same Day Surgery at [phone] between the hours of 1pm-3pm one day prior to your surgery. They will inform you of the time to arrive at Same Day Surgery which is located on the second floor of the [facility].   Please refer to the attached letter regarding instructions for Pre-Admission Testing. You will receive a call from the [facility] office regarding your appointment with them.  The Pre-Admission Testing office is located at [place], on the first floor of the [facility] in [place] (office is to the right as you enter through the [employer] of the [facility]). Please have all medications you are currently taking and your insurance card available.   Patient was advised to have nothing to eat or drink after midnight the night prior to surgery except that he may have only water until 2 hours before surgery with nothing to drink within 2 hours of surgery.  The patient states he currently takes aspirin 81mg  & was informed to hold medication for 7 days prior to surgery beginning on 07/18/2018. Patient's questions were answered and he expressed  understanding of these instructions.

## 2018-07-08 NOTE — Patient Instructions (Signed)
Transurethral Resection of Bladder Tumor, Care After This sheet gives you information about how to care for yourself after your procedure. Your health care provider may also give you more specific instructions. If you have problems or questions, contact your health care provider. What can I expect after the procedure? After the procedure, it is common to have:  A small amount of blood in your urine for up to 2 weeks.  Soreness or mild pain from your catheter. After your catheter is removed, you may have mild soreness, especially when urinating.  Pain in your lower abdomen. Follow these instructions at home: Medicines   Take over-the-counter and prescription medicines only as told by your health care provider.  If you were prescribed an antibiotic medicine, take it as told by your health care provider. Do not stop taking the antibiotic even if you start to feel better.  Do not drive for 24 hours if you were given a sedative during your procedure.  Ask your health care provider if the medicine prescribed to you: ? Requires you to avoid driving or using heavy machinery. ? Can cause constipation. You may need to take these actions to prevent or treat constipation:  Take over-the-counter or prescription medicines.  Eat foods that are high in fiber, such as beans, whole grains, and fresh fruits and vegetables.  Limit foods that are high in fat and processed sugars, such as fried or sweet foods. Activity  Return to your normal activities as told by your health care provider. Ask your health care provider what activities are safe for you.  Do not lift anything that is heavier than 10 lb (4.5 kg), or the limit that you are told, until your health care provider says that it is safe.  Avoid intense physical activity for as long as told by your health care provider.  Rest as told by your health care provider.  Avoid sitting for a long time without moving. Get up to take short walks every  1-2 hours. This is important to improve blood flow and breathing. Ask for help if you feel weak or unsteady. General instructions   Do not drink alcohol for as long as told by your health care provider. This is especially important if you are taking prescription pain medicines.  Do not take baths, swim, or use a hot tub until your health care provider approves. Ask your health care provider if you may take showers. You may only be allowed to take sponge baths.  If you have a catheter, follow instructions from your health care provider about caring for your catheter and your drainage bag.  Drink enough fluid to keep your urine pale yellow.  Wear compression stockings as told by your health care provider. These stockings help to prevent blood clots and reduce swelling in your legs.  Keep all follow-up visits as told by your health care provider. This is important. ? You will need to be followed closely with regular checks of your bladder and urethra (cystoscopies) to make sure that the cancer does not come back. Contact a health care provider if:  You have pain that gets worse or does not improve with medicine.  You have blood in your urine for more than 2 weeks.  You have cloudy or bad-smelling urine.  You become constipated. Signs of constipation may include having: ? Fewer than three bowel movements in a week. ? Difficulty having a bowel movement. ? Stools that are dry, hard, or larger than normal.  You have  a fever. Get help right away if:  You have: ? Severe pain. ? Bright red blood in your urine. ? Blood clots in your urine. ? A lot of blood in your urine.  Your catheter has been removed and you are not able to urinate.  You have a catheter in place and the catheter is not draining urine. Summary  After your procedure, it is common to have a small amount of blood in your urine, soreness or mild pain from your catheter, and pain in your lower abdomen.  Take  over-the-counter and prescription medicines only as told by your health care provider.  Rest as told by your health care provider. Follow your health care provider's instructions about returning to normal activities. Ask what activities are safe for you.  If you have a catheter, follow instructions from your health care provider about caring for your catheter and your drainage bag.  Get help right away if you cannot urinate, you have severe pain, or you have bright red blood or blood clots in your urine. This information is not intended to replace advice given to you by your health care provider. Make sure you discuss any questions you have with your health care provider. Document Released: 05/23/2015 Document Revised: 01/09/2018 Document Reviewed: 01/09/2018 Elsevier Interactive Patient Education  2019 [employer] Transurethral Resection of Bladder Tumor  Transurethral resection of a bladder tumor is the removal (resection) of a cancerous growth (tumor) on the inside wall of the bladder. The bladder is the organ that holds urine. The tumor is removed through the tube that carries urine out of the body (urethra). In a transurethral resection, a thin telescope with a light, a tiny camera, and an electric cutting edge (resectoscope) is passed through the urethra. In men, the opening of the urethra is at the end of the penis. In women, it is just above the opening of the vagina. Tell a health care provider about:  Any allergies you have.  All medicines you are taking, including vitamins, herbs, eye drops, creams, and over-the-counter medicines.  Any problems you or family members have had with anesthetic medicines.  Any blood disorders you have.  Any surgeries you have had.  Any medical conditions you have.  Any recent urinary tract infections you have had.  Whether you are pregnant or may be pregnant. What are the risks? Generally, this is a safe procedure. However, problems may  occur, including:  Infection.  Bleeding.  Allergic reactions to medicines.  Damage to nearby structures or organs, such as: ? The urethra. ? The tubes that drain urine from the kidneys into the bladder (ureters).  Pain and burning during urination.  Difficulty urinating due to partial blockage of the urethra.  Inability to urinate (urinary retention). What happens before the procedure? Staying hydrated Follow instructions from your health care provider about hydration, which may include:  Up to 2 hours before the procedure - you may continue to drink clear liquids, such as water, clear fruit juice, black coffee, and plain tea.  Eating and drinking restrictions Follow instructions from your health care provider about eating and drinking, which may include:  8 hours before the procedure - stop eating heavy meals or foods, such as meat, fried foods, or fatty foods.  6 hours before the procedure - stop eating light meals or foods, such as toast or cereal.  6 hours before the procedure - stop drinking milk or drinks that contain milk.  2 hours before the procedure - stop   drinking clear liquids. Medicines Ask your health care provider about:  Changing or stopping your regular medicines. This is especially important if you are taking diabetes medicines or blood thinners.  Taking medicines such as aspirin and ibuprofen. These medicines can thin your blood. Do not take these medicines unless your health care provider tells you to take them.  Taking over-the-counter medicines, vitamins, herbs, and supplements. Tests You may have exams or tests, including:  Physical exam.  Blood tests.  Urine tests.  Electrocardiogram (ECG). This test measures the electrical activity of the heart. General instructions  Plan to have someone take you home from the hospital or clinic.  Ask your health care provider how your surgical site will be marked or identified.  Ask your health care  provider what steps will be taken to help prevent infection. These may include: ? Washing skin with a germ-killing soap. ? Taking antibiotic medicine. What happens during the procedure?  An IV will be inserted into one of your veins.  You will be given one or more of the following: ? A medicine to help you relax (sedative). ? A medicine to make you fall asleep (general anesthetic). ? A medicine that is injected into your spine to numb the area below and slightly above the injection site (spinal anesthetic).  Your legs will be placed in foot rests (stirrups) so that your legs are apart and your knees are bent.  The resectoscope will be passed through your urethra and into your bladder.  The part of your bladder that is affected by the tumor will be resected using the cutting edge of the resectoscope.  The resectoscope will be removed.  A thin, flexible tube (catheter) will be passed through your urethra and into your bladder. The catheter will drain urine into a bag outside of your body. ? Fluid may be passed through the catheter to keep the catheter open. The procedure may vary among health care providers and hospitals. What happens after the procedure?  Your blood pressure, heart rate, breathing rate, and blood oxygen level will be monitored until you leave the hospital or clinic.  You may continue to receive fluids and medicines through an IV.  You will have some pain. You will be given pain medicine to relieve pain.  You will have a catheter to drain your urine. ? You will have blood in your urine. Your catheter may be kept in until your urine is clear. ? The amount of urine will be monitored. If necessary, your bladder may be rinsed out (irrigated) by passing fluid through your catheter.  You will be encouraged to walk around as soon as possible.  You may have to wear compression stockings. These stockings help to prevent blood clots and reduce swelling in your legs.  Do  not drive for 24 hours if you were given a sedative during your procedure. Summary  Transurethral resection of a bladder tumor is the removal (resection) of a cancerous growth (tumor) on the inside wall of the bladder.  To do this procedure, your health care provider uses a thin telescope with a light, a tiny camera, and an electric cutting edge (resectoscope).  Follow your health care provider's instructions. You may need to stop or change certain medicines, and you may be told to stop eating and drinking several hours before the procedure.  Your blood pressure, heart rate, breathing rate, and blood oxygen level will be monitored until you leave the hospital or clinic.  You may have to wear   compression stockings. These stockings help to prevent blood clots and reduce swelling in your legs. This information is not intended to replace advice given to you by your health care provider. Make sure you discuss any questions you have with your health care provider. Document Released: 04/07/2009 Document Revised: 01/10/2018 Document Reviewed: 01/10/2018 Elsevier Interactive Patient Education  2019 [employer] Inc.  

## 2018-07-09 ENCOUNTER — Telehealth: Payer: Self-pay

## 2018-07-09 ENCOUNTER — Other Ambulatory Visit: Payer: Self-pay | Admitting: Radiology

## 2018-07-09 DIAGNOSIS — N3289 Other specified disorders of bladder: Secondary | ICD-10-CM

## 2018-07-09 NOTE — Telephone Encounter (Signed)
-----   Message from Billey Co, MD sent at 07/09/2018 10:02 AM EST ----- Please let him know his CT confirms likely bladder tumor that was seen previously. No concerning findings in the kidneys, and no evidence of any tumors outside the bladder. CXR also looked good, no worrisome spots.  Follow up for TURBT as already scheduled, no changes to the original plan as discussed in clinic yesterday.  Nickolas Madrid, MD 07/09/2018

## 2018-07-09 NOTE — Telephone Encounter (Signed)
Patient aware of results and recommendations. °

## 2018-07-16 ENCOUNTER — Inpatient Hospital Stay: Admission: RE | Admit: 2018-07-16 | Payer: Medicare Other | Source: Ambulatory Visit

## 2018-07-24 ENCOUNTER — Encounter: Payer: Self-pay | Admitting: Urology

## 2018-07-24 ENCOUNTER — Ambulatory Visit (INDEPENDENT_AMBULATORY_CARE_PROVIDER_SITE_OTHER): Payer: Medicare Other | Admitting: Urology

## 2018-07-24 ENCOUNTER — Ambulatory Visit: Payer: Medicare Other | Admitting: Urology

## 2018-07-24 VITALS — BP 136/64 | HR 59 | Ht 68.0 in | Wt 300.0 lb

## 2018-07-24 DIAGNOSIS — R310 Gross hematuria: Secondary | ICD-10-CM

## 2018-07-24 DIAGNOSIS — N3289 Other specified disorders of bladder: Secondary | ICD-10-CM | POA: Diagnosis not present

## 2018-07-24 NOTE — Patient Instructions (Signed)

## 2018-07-24 NOTE — Progress Notes (Signed)
07/24/2018  10:05 AM   Kristian Covey 10-27-1931 [id number]  Referring provider: Rusty Aus, MD [facility] [place], [place] [address]  Chief Complaint  Patient presents with  . Advice Only   HPI: Keith Cain is a 85 y.o. White or Caucasian male that presents today for a second opinion. He is accompanied by his family.  - Seen in ED on 07/05/18 with c/o gross hematuria, clots and dysuria  - CT Abd/pelvis wo contrast (07/05/18) with possible 4-cm anterior wall bladder mass with concerns of malignancy, focal dilation of right ureter in pelvis (unclear etiology)  - >1year of hematuria with intermittent clots, currently pink to brown urine without clots  - Smoking History: 30 packs/year. Quit >40 years ago  - CT Hematuria Workup (07/08/18) with no evidence of upper tract lesion, renal mass or hydronephrosis. Limited bladder evaluation by artifact from bilateral hip arthroplasties and lack of delayed post-contrast prone imaging. Persistent concern of possible mass involving anterior bladder as previously questioned.  - Chest X-ray clear. Cleared by PCP    PMH: Past Medical History:  Diagnosis Date  . Arthritis    RA  . Asthma   . Cancer ([place])    melanoma  . Chronic kidney disease   . Complication of anesthesia    HARD TO WAKE UP PATIENT STATES PCP SAID NO IV SEDATION/ HIGH RISK SINCE HARD TO WAKE UP  . Diabetes mellitus without complication ([place])   . Edema   . Hypertension   . Lung nodules   . Oxygen deficiency    WEARS HS  . Prostate disorder   . Sleep apnea    CPAP    Surgical History: Past Surgical History:  Procedure Laterality Date  . APPENDECTOMY    . CATARACT EXTRACTION W/PHACO Right 08/30/2016   Procedure: CATARACT EXTRACTION PHACO AND INTRAOCULAR LENS PLACEMENT (IOC);  Surgeon: Eulogio Bear, MD;  Location: [facility] ORS;  Service: Ophthalmology;  Laterality: Right;  Lot# [id number] H [place]: 00:55.5 AP%: 11.0 CDE: 6.08  .  CATARACT EXTRACTION W/PHACO Left 09/27/2016   Procedure: CATARACT EXTRACTION PHACO AND INTRAOCULAR LENS PLACEMENT (IOC);  Surgeon: Eulogio Bear, MD;  Location: [facility] ORS;  Service: Ophthalmology;  Laterality: Left;  [place] 54.9 AP% 13.4 CDE 7.36 Fluid pack lot # [id number]  . FOREARM SURGERY     RIGHT  . JOINT REPLACEMENT     TH X 3  . RCR     Home Medications:  Allergies as of 07/24/2018      Reactions   Codeine Shortness Of Breath, Rash   Morphine And Related Shortness Of Breath, Rash   Percocet [oxycodone-acetaminophen] Shortness Of Breath, Rash   Versed [midazolam] Anaphylaxis   Vicodin [hydrocodone-acetaminophen] Shortness Of Breath, Rash   Ambien [zolpidem Tartrate]    Hallucinations       Medication List       Accurate as of July 24, 2018 10:05 AM. Always use your most recent med list.        acetaminophen 500 MG tablet Commonly known as:  TYLENOL Take 500 mg by mouth every 8 (eight) hours as needed for moderate pain or headache.   amLODipine 5 MG tablet Commonly known as:  NORVASC Take 5 mg by mouth 2 (two) times daily.   aspirin EC 81 MG tablet Take 81 mg by mouth daily.   atorvastatin 10 MG tablet Commonly known as:  LIPITOR Take 10 mg by mouth at bedtime.   colchicine 0.6 MG tablet  Take 0.6 mg by mouth 2 (two) times daily as needed.   diclofenac sodium 1 % Gel Commonly known as:  VOLTAREN Apply 2 g topically 4 (four) times daily. Apply three times a day over the R elbow   furosemide 20 MG tablet Commonly known as:  LASIX Take 20 mg by mouth daily.   glimepiride 1 MG tablet Commonly known as:  AMARYL Take 1 mg by mouth daily.   losartan 100 MG tablet Commonly known as:  COZAAR Take 100 mg by mouth daily.   metFORMIN 500 MG tablet Commonly known as:  GLUCOPHAGE Take 1,000 mg by mouth 2 (two) times daily with a meal.   metoprolol succinate 50 MG 24 hr tablet Commonly known as:  TOPROL-XL Take 50 mg by mouth daily.   tamsulosin 0.4 MG Caps  capsule Commonly known as:  FLOMAX Take 0.4 mg by mouth daily.   vitamin B-12 1000 MCG tablet Commonly known as:  CYANOCOBALAMIN Take 1,000 mcg by mouth daily.       Allergies:  Allergies  Allergen Reactions  . Codeine Shortness Of Breath and Rash  . Morphine And Related Shortness Of Breath and Rash  . Percocet [Oxycodone-Acetaminophen] Shortness Of Breath and Rash  . Versed [Midazolam] Anaphylaxis  . Vicodin [Hydrocodone-Acetaminophen] Shortness Of Breath and Rash  . Ambien [Zolpidem Tartrate]     Hallucinations     Family History: No family history on file.  Social History:  reports that he quit smoking about 60 years ago. He has quit using smokeless tobacco. He reports that he does not drink alcohol or use drugs.  ROS: UROLOGY Frequent Urination?: Yes Hard to postpone urination?: Yes Burning/pain with urination?: No Get up at night to urinate?: Yes Leakage of urine?: No Urine stream starts and stops?: Yes Trouble starting stream?: No Do you have to strain to urinate?: No Blood in urine?: No Urinary tract infection?: No Sexually transmitted disease?: No Injury to kidneys or bladder?: Yes Painful intercourse?: No Weak stream?: No Erection problems?: No Penile pain?: No  Gastrointestinal Nausea?: No Vomiting?: No Indigestion/heartburn?: No Diarrhea?: No Constipation?: No  Constitutional Fever: No Night sweats?: No Weight loss?: No Fatigue?: No  Skin Skin rash/lesions?: No Itching?: No  Eyes Blurred vision?: No Double vision?: No  Ears/Nose/Throat Sore throat?: No Sinus problems?: No  Hematologic/Lymphatic Swollen glands?: No Easy bruising?: No  Cardiovascular Leg swelling?: Yes Chest pain?: No  Respiratory Cough?: No Shortness of breath?: No  Endocrine Excessive thirst?: No  Musculoskeletal Joint pain?: No  Neurological Headaches?: No Dizziness?: No  Psychologic Depression?: No Anxiety?: No  Physical Exam: BP 136/64  (BP Location: Left Arm, Patient Position: Sitting, Cuff Size: Large)   Pulse (!) 59   Ht 5\' 8"  (1.727 m)   Wt 300 lb (136.1 kg)   BMI 45.61 kg/m   Constitutional:  Alert and oriented, No acute distress. CV:RRR Respiratory: Normal respiratory effort, no increased work of breathing. Clear Head: Normocephalic and Atraumatic. GU: No CVA tenderness Skin: No rashes, bruises or suspicious lesions. Neurologic: Grossly intact, no focal deficits, moving all 4 extremities. Psychiatric: Normal mood and affect.  Laboratory Data: Lab Results  Component Value Date   WBC 16.5 (H) 07/05/2018   HGB 12.1 (L) 07/05/2018   HCT 36.6 (L) 07/05/2018   MCV 87.8 07/05/2018   PLT 317 07/05/2018   Lab Results  Component Value Date   CREATININE 0.97 07/05/2018   BUN 17 07/05/2018   NA 133 (L) 07/05/2018   K 4.3 07/05/2018  CL 99 07/05/2018   CO2 23 07/05/2018   Pertinent Imaging: Results for orders placed during the hospital encounter of 07/08/18  CT HEMATURIA WORKUP   Narrative CLINICAL DATA:  Follow up bladder tumor seen on prior CT. Hematuria. History of melanoma and appendectomy.  EXAM: CT ABDOMEN AND PELVIS WITHOUT AND WITH CONTRAST  TECHNIQUE: Multidetector CT imaging of the abdomen and pelvis was performed following the standard protocol before and following the bolus administration of intravenous contrast.  CONTRAST:  145mL ISOVUE-300 IOPAMIDOL (ISOVUE-300) INJECTION 61%  COMPARISON:  CT 07/05/2018 and 10/18/2017.  FINDINGS: Lower chest: Scattered small nodules at both lung bases are stable. There is prominent epicardial fat and mild aortic atherosclerosis. No significant pleural or pericardial effusion.  Hepatobiliary: The liver is normal in density without suspicious focal abnormality. No evidence of gallstones, gallbladder wall thickening or biliary dilatation. The gallbladder is incompletely distended.  Pancreas: Unremarkable. No pancreatic ductal dilatation  or surrounding inflammatory changes.  Spleen: Normal in size without focal abnormality other than small calcified granulomas.  Adrenals/Urinary Tract: Both adrenal glands appear normal. Possible tiny nonobstructing calculus in the mid right kidney is again noted. Both kidneys demonstrate mild cortical thinning, and there is a probable 11 mm cyst in the upper pole of the left kidney (image 33/2). No evidence of enhancing renal mass, hydronephrosis or upper tract urothelial lesion. Bladder evaluation again limited by artifact from the bilateral total hip arthroplasties. There is a persistent concern of an anterior bladder mass which manifests as an ill-defined area of increased density anteriorly on the immediate postcontrast images and a partial filling defect on the delayed post-contrast images which were obtained supine. Prone imaging would have provided better evaluation of the anterior bladder wall.  Stomach/Bowel: No evidence of bowel wall thickening, distention or surrounding inflammatory change. Diverticular changes of the descending and sigmoid colon again noted.  Vascular/Lymphatic: There are no enlarged abdominal or pelvic lymph nodes. Moderate aortic and branch vessel atherosclerosis without acute vascular findings.  Reproductive: The prostate gland and seminal vesicles appear stable without significant findings.  Other: No evidence of abdominal wall mass or hernia. No ascites.  Musculoskeletal: No acute or significant osseous findings. Status post bilateral total hip arthroplasty. There is some atrophy of the gluteus and erector spinae musculature bilaterally. Multilevel lumbar spondylosis noted with potentially significant spinal stenosis, greatest at L3-4 (L5 transitional).  IMPRESSION: 1. Bladder evaluation remains limited by artifact from the bilateral total hip arthroplasties and the lack of delayed post-contrast prone imaging. A bladder mass can not be  excluded by this examination, and there is persistent concern of a possible mass involving the anterior bladder as question previously. Cystoscopy recommended. 2. No evidence of upper tract lesion, renal mass or hydronephrosis. 3. Colonic diverticulosis, lumbar spondylosis and Aortic Atherosclerosis (ICD10-I70.0).   Electronically Signed   By: Richardean Sale M.D.   On: 07/08/2018 14:16    Assessment & Plan:   1. Gross Hematuria  - Patient wanted a second opinion for possible anterior bladder wall tumor. Will proceed with plan as per Dr. Doristine Counter last note.  - Addressed all patient and family questions and concerns  - Patient is agreeable with plan and would like to move forward with TURBT (Already scheduled for 08/20/2018)  - The indications and nature of the planned procedure were discussed as well as the potential benefits and expected outcome.  Alternatives have been discussed.  Potential complications were discussed including but not limited to bleeding, infection and bladder perforation. The postoperative care  and followup was reviewed.  The patient was informed that he may need additional treatment along with periodic surveillance cystoscopy.  All of his questions were answered and he desires to proceed.   Abbie Sons, MD [facility] [address], [place], [place] [address] [phone]  I, [phone] Renne Crigler , am acting as a scribe for Abbie Sons, MD  I, Abbie Sons, MD, have reviewed all documentation for this visit. The documentation on 07/24/18 for the exam, diagnosis, procedures, and orders are all accurate and complete.

## 2018-07-24 NOTE — H&P (View-Only) (Signed)
07/24/2018  10:05 AM   Kristian Covey 05/30/1932 [id number]  Referring provider: Rusty Aus, MD [facility] [place], [place] [address]  Chief Complaint  Patient presents with  . Advice Only   HPI: Keith Cain is a 85 y.o. White or Caucasian male that presents today for a second opinion. He is accompanied by his family.  - Seen in ED on 07/05/18 with c/o gross hematuria, clots and dysuria  - CT Abd/pelvis wo contrast (07/05/18) with possible 4-cm anterior wall bladder mass with concerns of malignancy, focal dilation of right ureter in pelvis (unclear etiology)  - >1year of hematuria with intermittent clots, currently pink to brown urine without clots  - Smoking History: 30 packs/year. Quit >40 years ago  - CT Hematuria Workup (07/08/18) with no evidence of upper tract lesion, renal mass or hydronephrosis. Limited bladder evaluation by artifact from bilateral hip arthroplasties and lack of delayed post-contrast prone imaging. Persistent concern of possible mass involving anterior bladder as previously questioned.  - Chest X-ray clear. Cleared by PCP    PMH: Past Medical History:  Diagnosis Date  . Arthritis    RA  . Asthma   . Cancer ([place])    melanoma  . Chronic kidney disease   . Complication of anesthesia    HARD TO WAKE UP PATIENT STATES PCP SAID NO IV SEDATION/ HIGH RISK SINCE HARD TO WAKE UP  . Diabetes mellitus without complication ([place])   . Edema   . Hypertension   . Lung nodules   . Oxygen deficiency    WEARS HS  . Prostate disorder   . Sleep apnea    CPAP    Surgical History: Past Surgical History:  Procedure Laterality Date  . APPENDECTOMY    . CATARACT EXTRACTION W/PHACO Right 08/30/2016   Procedure: CATARACT EXTRACTION PHACO AND INTRAOCULAR LENS PLACEMENT (IOC);  Surgeon: Eulogio Bear, MD;  Location: [facility] ORS;  Service: Ophthalmology;  Laterality: Right;  Lot# [id number] H [place]: 00:55.5 AP%: 11.0 CDE: 6.08  .  CATARACT EXTRACTION W/PHACO Left 09/27/2016   Procedure: CATARACT EXTRACTION PHACO AND INTRAOCULAR LENS PLACEMENT (IOC);  Surgeon: Eulogio Bear, MD;  Location: [facility] ORS;  Service: Ophthalmology;  Laterality: Left;  [place] 54.9 AP% 13.4 CDE 7.36 Fluid pack lot # [id number]  . FOREARM SURGERY     RIGHT  . JOINT REPLACEMENT     TH X 3  . RCR     Home Medications:  Allergies as of 07/24/2018      Reactions   Codeine Shortness Of Breath, Rash   Morphine And Related Shortness Of Breath, Rash   Percocet [oxycodone-acetaminophen] Shortness Of Breath, Rash   Versed [midazolam] Anaphylaxis   Vicodin [hydrocodone-acetaminophen] Shortness Of Breath, Rash   Ambien [zolpidem Tartrate]    Hallucinations       Medication List       Accurate as of July 24, 2018 10:05 AM. Always use your most recent med list.        acetaminophen 500 MG tablet Commonly known as:  TYLENOL Take 500 mg by mouth every 8 (eight) hours as needed for moderate pain or headache.   amLODipine 5 MG tablet Commonly known as:  NORVASC Take 5 mg by mouth 2 (two) times daily.   aspirin EC 81 MG tablet Take 81 mg by mouth daily.   atorvastatin 10 MG tablet Commonly known as:  LIPITOR Take 10 mg by mouth at bedtime.   colchicine 0.6 MG tablet  Take 0.6 mg by mouth 2 (two) times daily as needed.   diclofenac sodium 1 % Gel Commonly known as:  VOLTAREN Apply 2 g topically 4 (four) times daily. Apply three times a day over the R elbow   furosemide 20 MG tablet Commonly known as:  LASIX Take 20 mg by mouth daily.   glimepiride 1 MG tablet Commonly known as:  AMARYL Take 1 mg by mouth daily.   losartan 100 MG tablet Commonly known as:  COZAAR Take 100 mg by mouth daily.   metFORMIN 500 MG tablet Commonly known as:  GLUCOPHAGE Take 1,000 mg by mouth 2 (two) times daily with a meal.   metoprolol succinate 50 MG 24 hr tablet Commonly known as:  TOPROL-XL Take 50 mg by mouth daily.   tamsulosin 0.4 MG Caps  capsule Commonly known as:  FLOMAX Take 0.4 mg by mouth daily.   vitamin B-12 1000 MCG tablet Commonly known as:  CYANOCOBALAMIN Take 1,000 mcg by mouth daily.       Allergies:  Allergies  Allergen Reactions  . Codeine Shortness Of Breath and Rash  . Morphine And Related Shortness Of Breath and Rash  . Percocet [Oxycodone-Acetaminophen] Shortness Of Breath and Rash  . Versed [Midazolam] Anaphylaxis  . Vicodin [Hydrocodone-Acetaminophen] Shortness Of Breath and Rash  . Ambien [Zolpidem Tartrate]     Hallucinations     Family History: No family history on file.  Social History:  reports that he quit smoking about 60 years ago. He has quit using smokeless tobacco. He reports that he does not drink alcohol or use drugs.  ROS: UROLOGY Frequent Urination?: Yes Hard to postpone urination?: Yes Burning/pain with urination?: No Get up at night to urinate?: Yes Leakage of urine?: No Urine stream starts and stops?: Yes Trouble starting stream?: No Do you have to strain to urinate?: No Blood in urine?: No Urinary tract infection?: No Sexually transmitted disease?: No Injury to kidneys or bladder?: Yes Painful intercourse?: No Weak stream?: No Erection problems?: No Penile pain?: No  Gastrointestinal Nausea?: No Vomiting?: No Indigestion/heartburn?: No Diarrhea?: No Constipation?: No  Constitutional Fever: No Night sweats?: No Weight loss?: No Fatigue?: No  Skin Skin rash/lesions?: No Itching?: No  Eyes Blurred vision?: No Double vision?: No  Ears/Nose/Throat Sore throat?: No Sinus problems?: No  Hematologic/Lymphatic Swollen glands?: No Easy bruising?: No  Cardiovascular Leg swelling?: Yes Chest pain?: No  Respiratory Cough?: No Shortness of breath?: No  Endocrine Excessive thirst?: No  Musculoskeletal Joint pain?: No  Neurological Headaches?: No Dizziness?: No  Psychologic Depression?: No Anxiety?: No  Physical Exam: BP 136/64  (BP Location: Left Arm, Patient Position: Sitting, Cuff Size: Large)   Pulse (!) 59   Ht 5\' 8"  (1.727 m)   Wt 300 lb (136.1 kg)   BMI 45.61 kg/m   Constitutional:  Alert and oriented, No acute distress. CV:RRR Respiratory: Normal respiratory effort, no increased work of breathing. Clear Head: Normocephalic and Atraumatic. GU: No CVA tenderness Skin: No rashes, bruises or suspicious lesions. Neurologic: Grossly intact, no focal deficits, moving all 4 extremities. Psychiatric: Normal mood and affect.  Laboratory Data: Lab Results  Component Value Date   WBC 16.5 (H) 07/05/2018   HGB 12.1 (L) 07/05/2018   HCT 36.6 (L) 07/05/2018   MCV 87.8 07/05/2018   PLT 317 07/05/2018   Lab Results  Component Value Date   CREATININE 0.97 07/05/2018   BUN 17 07/05/2018   NA 133 (L) 07/05/2018   K 4.3 07/05/2018  CL 99 07/05/2018   CO2 23 07/05/2018   Pertinent Imaging: Results for orders placed during the hospital encounter of 07/08/18  CT HEMATURIA WORKUP   Narrative CLINICAL DATA:  Follow up bladder tumor seen on prior CT. Hematuria. History of melanoma and appendectomy.  EXAM: CT ABDOMEN AND PELVIS WITHOUT AND WITH CONTRAST  TECHNIQUE: Multidetector CT imaging of the abdomen and pelvis was performed following the standard protocol before and following the bolus administration of intravenous contrast.  CONTRAST:  181mL ISOVUE-300 IOPAMIDOL (ISOVUE-300) INJECTION 61%  COMPARISON:  CT 07/05/2018 and 10/18/2017.  FINDINGS: Lower chest: Scattered small nodules at both lung bases are stable. There is prominent epicardial fat and mild aortic atherosclerosis. No significant pleural or pericardial effusion.  Hepatobiliary: The liver is normal in density without suspicious focal abnormality. No evidence of gallstones, gallbladder wall thickening or biliary dilatation. The gallbladder is incompletely distended.  Pancreas: Unremarkable. No pancreatic ductal dilatation  or surrounding inflammatory changes.  Spleen: Normal in size without focal abnormality other than small calcified granulomas.  Adrenals/Urinary Tract: Both adrenal glands appear normal. Possible tiny nonobstructing calculus in the mid right kidney is again noted. Both kidneys demonstrate mild cortical thinning, and there is a probable 11 mm cyst in the upper pole of the left kidney (image 33/2). No evidence of enhancing renal mass, hydronephrosis or upper tract urothelial lesion. Bladder evaluation again limited by artifact from the bilateral total hip arthroplasties. There is a persistent concern of an anterior bladder mass which manifests as an ill-defined area of increased density anteriorly on the immediate postcontrast images and a partial filling defect on the delayed post-contrast images which were obtained supine. Prone imaging would have provided better evaluation of the anterior bladder wall.  Stomach/Bowel: No evidence of bowel wall thickening, distention or surrounding inflammatory change. Diverticular changes of the descending and sigmoid colon again noted.  Vascular/Lymphatic: There are no enlarged abdominal or pelvic lymph nodes. Moderate aortic and branch vessel atherosclerosis without acute vascular findings.  Reproductive: The prostate gland and seminal vesicles appear stable without significant findings.  Other: No evidence of abdominal wall mass or hernia. No ascites.  Musculoskeletal: No acute or significant osseous findings. Status post bilateral total hip arthroplasty. There is some atrophy of the gluteus and erector spinae musculature bilaterally. Multilevel lumbar spondylosis noted with potentially significant spinal stenosis, greatest at L3-4 (L5 transitional).  IMPRESSION: 1. Bladder evaluation remains limited by artifact from the bilateral total hip arthroplasties and the lack of delayed post-contrast prone imaging. A bladder mass can not be  excluded by this examination, and there is persistent concern of a possible mass involving the anterior bladder as question previously. Cystoscopy recommended. 2. No evidence of upper tract lesion, renal mass or hydronephrosis. 3. Colonic diverticulosis, lumbar spondylosis and Aortic Atherosclerosis (ICD10-I70.0).   Electronically Signed   By: Richardean Sale M.D.   On: 07/08/2018 14:16    Assessment & Plan:   1. Gross Hematuria  - Patient wanted a second opinion for possible anterior bladder wall tumor. Will proceed with plan as per Dr. Doristine Counter last note.  - Addressed all patient and family questions and concerns  - Patient is agreeable with plan and would like to move forward with TURBT (Already scheduled for 08/20/2018)  - The indications and nature of the planned procedure were discussed as well as the potential benefits and expected outcome.  Alternatives have been discussed.  Potential complications were discussed including but not limited to bleeding, infection and bladder perforation. The postoperative care  and followup was reviewed.  The patient was informed that he may need additional treatment along with periodic surveillance cystoscopy.  All of his questions were answered and he desires to proceed.   Abbie Sons, MD [place] [facility] [address], [place], [place] [address] [phone]  I, [phone] Renne Crigler , am acting as a scribe for Abbie Sons, MD  I, Abbie Sons, MD, have reviewed all documentation for this visit. The documentation on 07/24/18 for the exam, diagnosis, procedures, and orders are all accurate and complete.

## 2018-08-05 ENCOUNTER — Other Ambulatory Visit: Payer: Self-pay | Admitting: Radiology

## 2018-08-05 ENCOUNTER — Ambulatory Visit: Payer: Medicare Other | Admitting: Urology

## 2018-08-06 ENCOUNTER — Encounter
Admission: RE | Admit: 2018-08-06 | Discharge: 2018-08-06 | Disposition: A | Discharge status: Home / Self Care | Payer: Medicare Other | Source: Ambulatory Visit | Attending: Urology | Admitting: Urology

## 2018-08-06 ENCOUNTER — Other Ambulatory Visit: Payer: Self-pay

## 2018-08-06 DIAGNOSIS — E118 Type 2 diabetes mellitus with unspecified complications: Secondary | ICD-10-CM | POA: Diagnosis not present

## 2018-08-06 DIAGNOSIS — I10 Essential (primary) hypertension: Secondary | ICD-10-CM | POA: Diagnosis not present

## 2018-08-06 DIAGNOSIS — Z01818 Encounter for other preprocedural examination: Secondary | ICD-10-CM | POA: Diagnosis not present

## 2018-08-06 HISTORY — DX: Nausea with vomiting, unspecified: R11.2

## 2018-08-06 HISTORY — DX: Acute kidney failure, unspecified: N17.9

## 2018-08-06 HISTORY — DX: Polyneuropathy, unspecified: G62.9

## 2018-08-06 HISTORY — DX: Anemia, unspecified: D64.9

## 2018-08-06 HISTORY — DX: Other specified postprocedural states: Z98.890

## 2018-08-06 LAB — BASIC METABOLIC PANEL
ANION GAP: 9 (ref 5–15)
BUN: 17 mg/dL (ref 8–23)
CO2: 23 mmol/L (ref 22–32)
Calcium: 9 mg/dL (ref 8.9–10.3)
Chloride: 104 mmol/L (ref 98–111)
Creatinine, Ser: 0.91 mg/dL (ref 0.61–1.24)
GFR calc Af Amer: >60 mL/min (ref >60)
GFR calc non Af Amer: >60 mL/min (ref >60)
Glucose, Bld: 119 mg/dL — ABNORMAL HIGH (ref 70–99)
Potassium: 4 mmol/L (ref 3.5–5.1)
Sodium: 136 mmol/L (ref 135–145)

## 2018-08-06 LAB — URINALYSIS, ROUTINE W REFLEX MICROSCOPIC
Bacteria, UA: NONE SEEN
Bilirubin Urine: NEGATIVE
Glucose, UA: NEGATIVE mg/dL
Ketones, ur: NEGATIVE mg/dL
LEUKOCYTE UA: NEGATIVE
Nitrite: NEGATIVE
PH: 5 (ref 5.0–8.0)
Protein, ur: NEGATIVE mg/dL
RBC / HPF: >50 RBC/hpf — ABNORMAL HIGH (ref 0–5)
Specific Gravity, Urine: 1.012 (ref 1.005–1.030)
Squamous Epithelial / HPF: NONE SEEN (ref 0–5)

## 2018-08-06 LAB — CBC
HEMATOCRIT: 39.1 % (ref 39.0–52.0)
Hemoglobin: 13 g/dL (ref 13.0–17.0)
MCH: 28.8 pg (ref 26.0–34.0)
MCHC: 33.2 g/dL (ref 30.0–36.0)
MCV: 86.5 fL (ref 80.0–100.0)
Platelets: 200 10*3/uL (ref 150–400)
RBC: 4.52 MIL/uL (ref 4.22–5.81)
RDW: 13.9 % (ref 11.5–15.5)
WBC: 8.3 10*3/uL (ref 4.0–10.5)
nRBC: 0 % (ref 0.0–0.2)

## 2018-08-06 NOTE — Patient Instructions (Signed)
Your procedure is scheduled on: Wed 08/20/2018 Report to [facility]. To find out your arrival time please call [phone] between 1PM - 3PM on Tue 08/19/2018.  Remember: Instructions that are not followed completely may result in serious medical risk, up to and including death, or upon the discretion of your surgeon and anesthesiologist your surgery may need to be rescheduled.     _X__ 1. Do not eat food after midnight the night before your procedure.                 No gum chewing or hard candies. You may drink clear liquids up to 2 hours                 before you are scheduled to arrive for your surgery- DO not drink clear                 liquids within 2 hours of the start of your surgery.                 Clear Liquids include:  water,   __X__2.  On the morning of surgery brush your teeth with toothpaste and water, you                 may rinse your mouth with mouthwash if you wish.  Do not swallow any              toothpaste of mouthwash.     _X__ 3.  No Alcohol for 24 hours before or after surgery.   _X__ 4.  Do Not Smoke or use e-cigarettes For 24 Hours Prior to Your Surgery.                 Do not use any chewable tobacco products for at least 6 hours prior to                 surgery.  ____  5.  Bring all medications with you on the day of surgery if instructed.   __X__  6.  Notify your doctor if there is any change in your medical condition      (cold, fever, infections).     Do not wear jewelry, make-up, hairpins, clips or nail polish. Do not wear lotions, powders, or perfumes.  Do not shave 48 hours prior to surgery. Men may shave face and neck. Do not bring valuables to the hospital.    [facility] is not responsible for any belongings or valuables.  Contacts, dentures/partials or body piercings may not be worn into surgery. Bring a case for your contacts, glasses or hearing aids, a denture cup will be  supplied. Leave your suitcase in the car. After surgery it may be brought to your room. For patients admitted to the hospital, discharge time is determined by your treatment team.   Patients discharged the day of surgery will not be allowed to drive home.   Please read over the following fact sheets that you were given:   MRSA Information  __X__ Take these medicines the morning of surgery with A SIP OF WATER:    1. amLODipine (NORVASC)  2. metoprolol succinate (TOPROL-XL)   3. tamsulosin (FLOMAX)   4.  5. Colchicine if needed  6.  ____ Fleet Enema (as directed)   __  _ Use CHG Soap/SAGE wipes as directed  ____ Use inhalers on the day of surgery  __X__ Stop metformin/Janumet/Farxiga 2 days  prior to surgery    ____ Take 1/2 of usual insulin dose the night before surgery. No insulin the morning          of surgery.   __X__ Stop Blood Thinners Coumadin/Plavix/Xarelto/Pleta/Pradaxa/Eliquis/Effient/Aspirin     Contact your Surgeon, Cardiologist or Medical Doctor regarding  ability to stop your blood thinners  (USUALLY STOP 7 DAYS PRIOR TO PROCEDURE)  __X__ Stop Anti-inflammatories 7 days before surgery such as Advil, Ibuprofen, Motrin,  BC or Goodies Powder, Naprosyn, Naproxen, Aleve, Aspirin    __X__ Stop all herbal supplements, fish oil or vitamin E until after surgery. (YOU DON'T HAVE TO STOP THE B12)   __X__ Bring C-Pap to the hospital.

## 2018-08-07 LAB — URINE CULTURE: Culture: NO GROWTH

## 2018-08-19 MED ORDER — CEFAZOLIN SODIUM-DEXTROSE 2-4 GM/100ML-% IV SOLN
2.0000 g | INTRAVENOUS | Status: AC
  Administered 2018-08-20: 2 g via INTRAVENOUS

## 2018-08-20 ENCOUNTER — Ambulatory Visit: Payer: Medicare Other | Admitting: Certified Registered Nurse Anesthetist

## 2018-08-20 ENCOUNTER — Ambulatory Visit
Admission: RE | Admit: 2018-08-20 | Discharge: 2018-08-20 | Disposition: A | Discharge status: Home / Self Care | Payer: Medicare Other | Attending: Urology | Admitting: Urology

## 2018-08-20 ENCOUNTER — Encounter: Payer: Self-pay | Admitting: Anesthesiology

## 2018-08-20 ENCOUNTER — Encounter
Admission: RE | Disposition: A | Discharge status: Home / Self Care | Payer: Self-pay | Source: Home / Self Care | Attending: Urology

## 2018-08-20 ENCOUNTER — Other Ambulatory Visit: Payer: Self-pay

## 2018-08-20 DIAGNOSIS — Z85820 Personal history of malignant melanoma of skin: Secondary | ICD-10-CM | POA: Diagnosis not present

## 2018-08-20 DIAGNOSIS — Z87891 Personal history of nicotine dependence: Secondary | ICD-10-CM | POA: Diagnosis not present

## 2018-08-20 DIAGNOSIS — J45909 Unspecified asthma, uncomplicated: Secondary | ICD-10-CM | POA: Diagnosis not present

## 2018-08-20 DIAGNOSIS — N189 Chronic kidney disease, unspecified: Secondary | ICD-10-CM | POA: Insufficient documentation

## 2018-08-20 DIAGNOSIS — E1122 Type 2 diabetes mellitus with diabetic chronic kidney disease: Secondary | ICD-10-CM | POA: Diagnosis not present

## 2018-08-20 DIAGNOSIS — Z791 Long term (current) use of non-steroidal anti-inflammatories (NSAID): Secondary | ICD-10-CM | POA: Insufficient documentation

## 2018-08-20 DIAGNOSIS — C671 Malignant neoplasm of dome of bladder: Secondary | ICD-10-CM | POA: Insufficient documentation

## 2018-08-20 DIAGNOSIS — Z79899 Other long term (current) drug therapy: Secondary | ICD-10-CM | POA: Diagnosis not present

## 2018-08-20 DIAGNOSIS — Z7984 Long term (current) use of oral hypoglycemic drugs: Secondary | ICD-10-CM | POA: Insufficient documentation

## 2018-08-20 DIAGNOSIS — I129 Hypertensive chronic kidney disease with stage 1 through stage 4 chronic kidney disease, or unspecified chronic kidney disease: Secondary | ICD-10-CM | POA: Insufficient documentation

## 2018-08-20 DIAGNOSIS — Z7982 Long term (current) use of aspirin: Secondary | ICD-10-CM | POA: Diagnosis not present

## 2018-08-20 DIAGNOSIS — N3289 Other specified disorders of bladder: Secondary | ICD-10-CM

## 2018-08-20 DIAGNOSIS — Z885 Allergy status to narcotic agent status: Secondary | ICD-10-CM | POA: Diagnosis not present

## 2018-08-20 DIAGNOSIS — Z96643 Presence of artificial hip joint, bilateral: Secondary | ICD-10-CM | POA: Insufficient documentation

## 2018-08-20 DIAGNOSIS — G4730 Sleep apnea, unspecified: Secondary | ICD-10-CM | POA: Insufficient documentation

## 2018-08-20 DIAGNOSIS — D494 Neoplasm of unspecified behavior of bladder: Secondary | ICD-10-CM

## 2018-08-20 DIAGNOSIS — Z888 Allergy status to other drugs, medicaments and biological substances status: Secondary | ICD-10-CM | POA: Diagnosis not present

## 2018-08-20 DIAGNOSIS — R310 Gross hematuria: Secondary | ICD-10-CM | POA: Diagnosis not present

## 2018-08-20 HISTORY — PX: TRANSURETHRAL RESECTION OF BLADDER TUMOR: SHX2575

## 2018-08-20 LAB — GLUCOSE, CAPILLARY
Glucose-Capillary: 154 mg/dL — ABNORMAL HIGH (ref 70–99)
Glucose-Capillary: 154 mg/dL — ABNORMAL HIGH (ref 70–99)

## 2018-08-20 SURGERY — TURBT (TRANSURETHRAL RESECTION OF BLADDER TUMOR)
Anesthesia: General

## 2018-08-20 MED ORDER — SUCCINYLCHOLINE CHLORIDE 20 MG/ML IJ SOLN
INTRAMUSCULAR | Status: AC
  Filled 2018-08-20: qty 1

## 2018-08-20 MED ORDER — SUGAMMADEX SODIUM 500 MG/5ML IV SOLN
INTRAVENOUS | Status: AC
  Filled 2018-08-20: qty 5

## 2018-08-20 MED ORDER — CEFAZOLIN SODIUM-DEXTROSE 2-4 GM/100ML-% IV SOLN
INTRAVENOUS | Status: AC
  Filled 2018-08-20: qty 100

## 2018-08-20 MED ORDER — LIDOCAINE HCL (CARDIAC) PF 100 MG/5ML IV SOSY
PREFILLED_SYRINGE | INTRAVENOUS | Status: DC | PRN
  Administered 2018-08-20: 50 mg via INTRAVENOUS

## 2018-08-20 MED ORDER — FENTANYL CITRATE (PF) 100 MCG/2ML IJ SOLN
INTRAMUSCULAR | Status: AC
  Filled 2018-08-20: qty 2

## 2018-08-20 MED ORDER — EPHEDRINE SULFATE 50 MG/ML IJ SOLN
INTRAMUSCULAR | Status: DC | PRN
  Administered 2018-08-20: 10 mg via INTRAVENOUS

## 2018-08-20 MED ORDER — ONDANSETRON HCL 4 MG/2ML IJ SOLN
INTRAMUSCULAR | Status: AC
  Filled 2018-08-20: qty 2

## 2018-08-20 MED ORDER — DEXAMETHASONE SODIUM PHOSPHATE 4 MG/ML IJ SOLN
INTRAMUSCULAR | Status: AC
  Filled 2018-08-20: qty 1

## 2018-08-20 MED ORDER — LIDOCAINE HCL (PF) 2 % IJ SOLN
INTRAMUSCULAR | Status: AC
  Filled 2018-08-20: qty 10

## 2018-08-20 MED ORDER — PROPOFOL 10 MG/ML IV BOLUS
INTRAVENOUS | Status: AC
  Filled 2018-08-20: qty 40

## 2018-08-20 MED ORDER — PROPOFOL 10 MG/ML IV BOLUS
INTRAVENOUS | Status: DC | PRN
  Administered 2018-08-20: 50 mg via INTRAVENOUS
  Administered 2018-08-20: 150 mg via INTRAVENOUS

## 2018-08-20 MED ORDER — FAMOTIDINE 20 MG PO TABS
ORAL_TABLET | ORAL | Status: AC
  Administered 2018-08-20: 20 mg via ORAL
  Filled 2018-08-20: qty 1

## 2018-08-20 MED ORDER — SODIUM CHLORIDE 0.9 % IV SOLN
INTRAVENOUS | Status: DC
  Administered 2018-08-20: 10:00:00 via INTRAVENOUS

## 2018-08-20 MED ORDER — ONDANSETRON HCL 4 MG/2ML IJ SOLN
INTRAMUSCULAR | Status: DC | PRN
  Administered 2018-08-20: 4 mg via INTRAVENOUS

## 2018-08-20 MED ORDER — GLYCOPYRROLATE 0.2 MG/ML IJ SOLN
INTRAMUSCULAR | Status: DC | PRN
  Administered 2018-08-20: 0.2 mg via INTRAVENOUS

## 2018-08-20 MED ORDER — EPHEDRINE SULFATE 50 MG/ML IJ SOLN
INTRAMUSCULAR | Status: AC
  Filled 2018-08-20: qty 1

## 2018-08-20 MED ORDER — ROCURONIUM BROMIDE 100 MG/10ML IV SOLN
INTRAVENOUS | Status: DC | PRN
  Administered 2018-08-20: 10 mg via INTRAVENOUS
  Administered 2018-08-20: 15 mg via INTRAVENOUS
  Administered 2018-08-20: 35 mg via INTRAVENOUS
  Administered 2018-08-20 (×2): 5 mg via INTRAVENOUS

## 2018-08-20 MED ORDER — ROCURONIUM BROMIDE 50 MG/5ML IV SOLN
INTRAVENOUS | Status: AC
  Filled 2018-08-20: qty 1

## 2018-08-20 MED ORDER — SUGAMMADEX SODIUM 200 MG/2ML IV SOLN
INTRAVENOUS | Status: DC | PRN
  Administered 2018-08-20: 300 mg via INTRAVENOUS

## 2018-08-20 MED ORDER — FENTANYL CITRATE (PF) 100 MCG/2ML IJ SOLN
INTRAMUSCULAR | Status: DC | PRN
  Administered 2018-08-20 (×4): 50 ug via INTRAVENOUS

## 2018-08-20 MED ORDER — SUCCINYLCHOLINE CHLORIDE 20 MG/ML IJ SOLN
INTRAMUSCULAR | Status: DC | PRN
  Administered 2018-08-20: 100 mg via INTRAVENOUS

## 2018-08-20 MED ORDER — DEXAMETHASONE SODIUM PHOSPHATE 10 MG/ML IJ SOLN
INTRAMUSCULAR | Status: DC | PRN
  Administered 2018-08-20: 4 mg via INTRAVENOUS

## 2018-08-20 MED ORDER — FENTANYL CITRATE (PF) 100 MCG/2ML IJ SOLN: 25.0000 ug | INTRAMUSCULAR | Status: DC | PRN

## 2018-08-20 MED ORDER — FAMOTIDINE 20 MG PO TABS
20.0000 mg | ORAL_TABLET | Freq: Once | ORAL | Status: AC
  Administered 2018-08-20: 20 mg via ORAL

## 2018-08-20 SURGICAL SUPPLY — 28 items
BAG DRAIN CYSTO-URO LG1000N (MISCELLANEOUS) ×1 IMPLANT
BAG URINE DRAINAGE (UROLOGICAL SUPPLIES) ×3 IMPLANT
BAG URO DRAIN 2000ML W/SPOUT (MISCELLANEOUS) ×2 IMPLANT
CATH FOL 2WAY LX 20X30 (CATHETERS) ×2 IMPLANT
CATH FOLEY 2WAY  5CC 16FR (CATHETERS)
CATH URTH 16FR FL 2W BLN LF (CATHETERS) IMPLANT
DRAPE UTILITY 15X26 TOWEL STRL (DRAPES) ×3 IMPLANT
DRSG TELFA 4X3 1S NADH ST (GAUZE/BANDAGES/DRESSINGS) ×3 IMPLANT
ELECT BIVAP BIPO 22/24 DONUT (ELECTROSURGICAL) ×3
ELECT COAG BIPOLAR CYL 1.2MMM (ELECTROSURGICAL) ×3
ELECT LOOP 22F BIPOLAR SML (ELECTROSURGICAL)
ELECT REM PT RETURN 9FT ADLT (ELECTROSURGICAL)
ELECTRD BIVAP BIPO 22/24 DONUT (ELECTROSURGICAL) IMPLANT
ELECTRODE COAG BIPLR CYL 1.2MM (ELECTROSURGICAL) IMPLANT
ELECTRODE LOOP 22F BIPOLAR SML (ELECTROSURGICAL) IMPLANT
ELECTRODE REM PT RTRN 9FT ADLT (ELECTROSURGICAL) IMPLANT
GLOVE BIO SURGEON STRL SZ8 (GLOVE) ×3 IMPLANT
GOWN STRL REUS W/ TWL LRG LVL3 (GOWN DISPOSABLE) ×1 IMPLANT
GOWN STRL REUS W/TWL LRG LVL3 (GOWN DISPOSABLE) ×2
GOWN STRL REUS W/TWL XL LVL4 (GOWN DISPOSABLE) ×3 IMPLANT
IV NS IRRIG 3000ML ARTHROMATIC (IV SOLUTION) ×22 IMPLANT
KIT TURNOVER CYSTO (KITS) ×3 IMPLANT
LOOP CUT BIPOLAR 24F LRG (ELECTROSURGICAL) ×4 IMPLANT
PACK CYSTO AR (MISCELLANEOUS) ×3 IMPLANT
SET IRRIG Y TYPE TUR BLADDER L (SET/KITS/TRAYS/PACK) ×3 IMPLANT
SURGILUBE 2OZ TUBE FLIPTOP (MISCELLANEOUS) ×3 IMPLANT
SYRINGE IRR TOOMEY STRL 70CC (SYRINGE) ×3 IMPLANT
WATER STERILE IRR 1000ML POUR (IV SOLUTION) ×3 IMPLANT

## 2018-08-20 NOTE — Discharge Instructions (Signed)

## 2018-08-20 NOTE — Interval H&P Note (Signed)
History and Physical Interval Note:  08/20/2018 10:56 AM  Keith Cain  has presented today for surgery, with the diagnosis of bladder mass  The various methods of treatment have been discussed with the patient and family. After consideration of risks, benefits and other options for treatment, the patient has consented to  Procedure(s): TRANSURETHRAL RESECTION OF BLADDER TUMOR (TURBT) (N/A) as a surgical intervention .  The patient's history has been reviewed, patient examined, no change in status, stable for surgery.  I have reviewed the patient's chart and labs.  Questions were answered to the patient's satisfaction.     [place]

## 2018-08-20 NOTE — Transfer of Care (Signed)
Immediate Anesthesia Transfer of Care Note  Patient: Keith Cain  Procedure(s) Performed: TRANSURETHRAL RESECTION OF BLADDER TUMOR (TURBT) (N/A )  Patient Location: PACU  Anesthesia Type:General  Level of Consciousness: awake  Airway & Oxygen Therapy: Patient Spontanous Breathing and Patient connected to face mask oxygen  Post-op Assessment: Report given to RN and Post -op Vital signs reviewed and stable  Post vital signs: Reviewed  Last Vitals:  Vitals Value Taken Time  BP 134/65 08/20/2018  1:12 PM  Temp    Pulse 66 08/20/2018  1:12 PM  Resp 12 08/20/2018  1:12 PM  SpO2 98 % 08/20/2018  1:12 PM    Last Pain:  Vitals:   08/20/18 1008  TempSrc: Temporal  PainSc: 0-No pain         Complications: No apparent anesthesia complications

## 2018-08-20 NOTE — Anesthesia Post-op Follow-up Note (Signed)
Anesthesia QCDR form completed.        

## 2018-08-20 NOTE — Anesthesia Preprocedure Evaluation (Addendum)
Anesthesia Evaluation  Patient identified by MRN, date of birth, ID band Patient awake    Reviewed: Allergy & Precautions, H&P , NPO status , Patient's Chart, lab work & pertinent test results  History of Anesthesia Complications (+) PROLONGED EMERGENCE and history of anesthetic complications  Airway Mallampati: III  TM Distance: <3 FB Neck ROM: limited    Dental  (+) Poor Dentition, Missing, Edentulous Upper, Edentulous Lower   Pulmonary asthma , sleep apnea and Continuous Positive Airway Pressure Ventilation , former smoker,           Cardiovascular Exercise Tolerance: Good hypertension, (-) angina(-) Past MI and (-) DOE      Neuro/Psych negative neurological ROS  negative psych ROS   GI/Hepatic negative GI ROS, Neg liver ROS,   Endo/Other  diabetes, Type 2  Renal/GU Renal disease     Musculoskeletal  (+) Arthritis ,   Abdominal   Peds  Hematology negative hematology ROS (+)   Anesthesia Other Findings Past Medical History: No date: Acute renal failure ([facility]) No date: Anemia No date: Arthritis     Comment:  RA No date: Asthma No date: Cancer ([place])     Comment:  melanoma No date: Chronic kidney disease No date: Complication of anesthesia     Comment:  HARD TO WAKE UP PATIENT STATES PCP SAID NO IV SEDATION/               HIGH RISK SINCE HARD TO WAKE UP No date: Diabetes mellitus without complication ([facility]) No date: Edema No date: Hypertension No date: Lung nodules No date: Neuropathy No date: Oxygen deficiency     Comment:  WEARS HS No date: PONV (postoperative nausea and vomiting) No date: Prostate disorder No date: Sleep apnea     Comment:  CPAP  Past Surgical History: No date: APPENDECTOMY No date: BACK SURGERY     Comment:  lumbar 08/30/2016: CATARACT EXTRACTION W/PHACO; Right     Comment:  Procedure: CATARACT EXTRACTION PHACO AND INTRAOCULAR               LENS PLACEMENT (IOC);  Surgeon: Eulogio Bear, MD;                Location: [facility] ORS;  Service: Ophthalmology;  Laterality:              Right;  Lot# [id number] H [place]: 00:55.5 AP%: 11.0 CDE: 6.08 09/27/2016: CATARACT EXTRACTION W/PHACO; Left     Comment:  Procedure: CATARACT EXTRACTION PHACO AND INTRAOCULAR               LENS PLACEMENT (IOC);  Surgeon: Eulogio Bear, MD;                Location: [facility] ORS;  Service: Ophthalmology;  Laterality:              Left;  [place] 54.9 AP% 13.4 CDE 7.36 Fluid pack lot #               [id number] No date: FOREARM SURGERY     Comment:  RIGHT No date: JOINT REPLACEMENT     Comment:  TH X 3 No date: RCR     Reproductive/Obstetrics negative OB ROS                             Anesthesia Physical Anesthesia Plan  ASA: III  Anesthesia Plan: General ETT   Post-op Pain Management:    Induction: Intravenous  PONV Risk Score and Plan: Ondansetron, Dexamethasone and Treatment may vary due to age or medical condition  Airway Management Planned: Oral ETT and Video Laryngoscope Planned  Additional Equipment:   Intra-op Plan:   Post-operative Plan: Extubation in OR and Possible Post-op intubation/ventilation  Informed Consent: I have reviewed the patients History and Physical, chart, labs and discussed the procedure including the risks, benefits and alternatives for the proposed anesthesia with the patient or authorized representative who has indicated his/her understanding and acceptance.     Dental Advisory Given  Plan Discussed with: Anesthesiologist, CRNA and Surgeon  Anesthesia Plan Comments: (Patient consented for risks of anesthesia including but not limited to:  - adverse reactions to medications - damage to teeth, lips or other oral mucosa - sore throat or hoarseness - Damage to heart, brain, lungs or loss of life  Patient voiced understanding.)       Anesthesia Quick Evaluation

## 2018-08-20 NOTE — Anesthesia Postprocedure Evaluation (Signed)
Anesthesia Post Note  Patient: Keith Cain  Procedure(s) Performed: TRANSURETHRAL RESECTION OF BLADDER TUMOR (TURBT) (N/A )  Patient location during evaluation: PACU Anesthesia Type: General Level of consciousness: awake and alert Pain management: pain level controlled Vital Signs Assessment: post-procedure vital signs reviewed and stable Respiratory status: spontaneous breathing, nonlabored ventilation, respiratory function stable and patient connected to nasal cannula oxygen Cardiovascular status: blood pressure returned to baseline and stable Postop Assessment: no apparent nausea or vomiting Anesthetic complications: no     Last Vitals:  Vitals:   08/20/18 1342 08/20/18 1357  BP: (!) 156/60 (!) 147/61  Pulse: (!) 56 (!) 55  Resp: 14 12  Temp:  36.7 C  SpO2: 95% 97%    Last Pain:  Vitals:   08/20/18 1357  TempSrc:   PainSc: 0-No pain                 Precious Haws Piscitello

## 2018-08-20 NOTE — Anesthesia Procedure Notes (Signed)
Procedure Name: Intubation Performed by: Chere Babson, CRNA Pre-anesthesia Checklist: Patient identified, Patient being monitored, Timeout performed, Emergency Drugs available and Suction available Patient Re-evaluated:Patient Re-evaluated prior to induction Oxygen Delivery Method: Circle system utilized Preoxygenation: Pre-oxygenation with 100% oxygen Induction Type: IV induction Ventilation: Mask ventilation without difficulty Laryngoscope Size: Miller and 2 Grade View: Grade I Tube type: Oral Tube size: 7.5 mm Number of attempts: 1 Airway Equipment and Method: Stylet Placement Confirmation: ETT inserted through vocal cords under direct vision,  positive ETCO2 and breath sounds checked- equal and bilateral Secured at: 23 cm Tube secured with: Tape Dental Injury: Teeth and Oropharynx as per pre-operative assessment        

## 2018-08-21 LAB — SURGICAL PATHOLOGY

## 2018-08-21 NOTE — Op Note (Signed)
Preoperative diagnosis: 1. Bladder tumor (~ 4cm)  Postoperative diagnosis:  1. Bladder tumor (~ 4cm)  Procedure:  1. Cystoscopy 2. Transurethral resection of bladder tumor (4cm)  Surgeon: Nicki Reaper C. , M.D.  Anesthesia: General  Complications: None  Intraoperative findings:  1. Bladder tumor: ~4 cm papillary tumor, high-grade appearing endoscopically bladder dome anteriorly  EBL: 50 mL  Specimens: 1. Bladder tumor   Indication: Keith Cain is a 85 year old male with a history of intermittent gross hematuria.  CT urogram showed no upper tract abnormalities but findings concerning for an anterior wall bladder tumor.  Office cystoscopy has not been performed.  We will proceed with cystoscopy under anesthesia and TURBT if tumor confirmed.  After reviewing the management options for treatment, he elected to proceed with the above surgical procedure(s). We have discussed the potential benefits and risks of the procedure, side effects of the proposed treatment, the likelihood of the patient achieving the goals of the procedure, and any potential problems that might occur during the procedure or recuperation. Informed consent has been obtained.  Description of procedure:  The patient was taken to the operating room and general anesthesia was induced.  The patient was placed in the dorsal lithotomy position, prepped and draped in the usual sterile fashion, and preoperative antibiotics were administered. A preoperative time-out was performed.   Cystourethroscopy was performed.  The patient's urethra was examined and was normal without lesions or stricture.  The prostate demonstrated moderate lateral lobe enlargement and moderate bladder neck elevation.   The bladder was then systematically examined in its entirety with findings as described above.  The cystoscope was removed.  A 26 French continuous-flow resectoscope sheath with visual obturator was lubricated and passed per urethra.    The bladder was then re-examined after the resectoscope was placed.  The bladder tumor was 4 cm.  It was located in the anterior aspect of the dome region. Using loop cautery resection with external suprapubic pressure, the entire tumor was resected and removed for permanent pathologic analysis.    Hemostasis was then achieved with the loop cautery and the bladder was emptied and reinspected with no further bleeding noted at the end of the procedure.   The bladder was then emptied and a 20 French Foley catheter was placed with return of clear effluent on irrigation. .  The patient appeared to tolerate the procedure well and without complications.  After anesthetic reversal he was transported to the PACU in satisfactory condition.     C. Bernardo Heater,  MD

## 2018-09-01 ENCOUNTER — Ambulatory Visit (INDEPENDENT_AMBULATORY_CARE_PROVIDER_SITE_OTHER): Payer: Medicare Other | Admitting: Urology

## 2018-09-01 ENCOUNTER — Encounter: Payer: Self-pay | Admitting: Urology

## 2018-09-01 VITALS — BP 157/54 | HR 64 | Ht 68.0 in | Wt 300.7 lb

## 2018-09-01 DIAGNOSIS — C679 Malignant neoplasm of bladder, unspecified: Secondary | ICD-10-CM

## 2018-09-01 NOTE — Progress Notes (Signed)
09/01/2018 8:27 AM   Keith Cain [birth year] [id number]  Referring provider: Rusty Aus, MD [facility] [place], [name] [address]  Chief Complaint  Patient presents with  . Routine Post Op    HPI: 85 year old male status post TURBT on 08/20/2018.  He had a 3 cm anterior tumor at the dome.  He had no post operative complaints.  I called him last week regarding his pathology which showed high-grade urothelial carcinoma.  It looks like lamina propria invasion was present however no muscle was seen in the specimen.  The pathology report has been discussed in detail.   PMH: Past Medical History:  Diagnosis Date  . Acute renal failure ([place])   . Anemia   . Arthritis    RA  . Asthma   . Cancer ([place])    melanoma  . Chronic kidney disease   . Complication of anesthesia    HARD TO WAKE UP PATIENT STATES PCP SAID NO IV SEDATION/ HIGH RISK SINCE HARD TO WAKE UP  . Diabetes mellitus without complication ([place])   . Edema   . Hypertension   . Lung nodules   . Neuropathy   . Oxygen deficiency    WEARS HS  . PONV (postoperative nausea and vomiting)   . Prostate disorder   . Sleep apnea    CPAP    Surgical History: Past Surgical History:  Procedure Laterality Date  . APPENDECTOMY    . BACK SURGERY     lumbar  . CATARACT EXTRACTION W/PHACO Right 08/30/2016   Procedure: CATARACT EXTRACTION PHACO AND INTRAOCULAR LENS PLACEMENT (IOC);  Surgeon: Eulogio Bear, MD;  Location: [facility] ORS;  Service: Ophthalmology;  Laterality: Right;  Lot# [id number] H [place]: 00:55.5 AP%: 11.0 CDE: 6.08  . CATARACT EXTRACTION W/PHACO Left 09/27/2016   Procedure: CATARACT EXTRACTION PHACO AND INTRAOCULAR LENS PLACEMENT (IOC);  Surgeon: Eulogio Bear, MD;  Location: [facility] ORS;  Service: Ophthalmology;  Laterality: Left;  [place] 54.9 AP% 13.4 CDE 7.36 Fluid pack lot # [id number]  . FOREARM SURGERY     RIGHT  . JOINT REPLACEMENT     TH X 3  . RCR    .  TRANSURETHRAL RESECTION OF BLADDER TUMOR N/A 08/20/2018   Procedure: TRANSURETHRAL RESECTION OF BLADDER TUMOR (TURBT);  Surgeon: Abbie Sons, MD;  Location: [facility] ORS;  Service: Urology;  Laterality: N/A;    Home Medications:  Allergies as of 09/01/2018      Reactions   Codeine Shortness Of Breath, Rash   Dilaudid [hydromorphone Hcl] Other (See Comments)   Stopped breathing   Morphine And Related Shortness Of Breath, Rash   Percocet [oxycodone-acetaminophen] Shortness Of Breath, Rash   Versed [midazolam] Anaphylaxis   Vicodin [hydrocodone-acetaminophen] Shortness Of Breath, Rash   Ambien [zolpidem Tartrate]    Hallucinations       Medication List       Accurate as of September 01, 2018  8:27 AM. Always use your most recent med list.        acetaminophen 500 MG tablet Commonly known as:  TYLENOL Take 500 mg by mouth every 8 (eight) hours as needed for moderate pain or headache.   amLODipine 5 MG tablet Commonly known as:  NORVASC Take 5 mg by mouth 2 (two) times daily.   aspirin EC 81 MG tablet Take 81 mg by mouth daily.   atorvastatin 10 MG tablet Commonly known as:  LIPITOR Take 10 mg by mouth at bedtime.  colchicine 0.6 MG tablet Take 0.6 mg by mouth 2 (two) times daily as needed.   doxazosin 2 MG tablet Commonly known as:  CARDURA   furosemide 20 MG tablet Commonly known as:  LASIX Take 20 mg by mouth daily.   glimepiride 1 MG tablet Commonly known as:  AMARYL Take 1 mg by mouth daily.   losartan 100 MG tablet Commonly known as:  COZAAR Take 100 mg by mouth daily.   metFORMIN 500 MG tablet Commonly known as:  GLUCOPHAGE Take 1,000 mg by mouth 2 (two) times daily with a meal.   metoprolol succinate 50 MG 24 hr tablet Commonly known as:  TOPROL-XL Take 50 mg by mouth daily.   tamsulosin 0.4 MG Caps capsule Commonly known as:  FLOMAX Take 0.4 mg by mouth daily.   vitamin B-12 1000 MCG tablet Commonly known as:  CYANOCOBALAMIN Take 1,000 mcg by  mouth daily.       Allergies:  Allergies  Allergen Reactions  . Codeine Shortness Of Breath and Rash  . Dilaudid [Hydromorphone Hcl] Other (See Comments)    Stopped breathing  . Morphine And Related Shortness Of Breath and Rash  . Percocet [Oxycodone-Acetaminophen] Shortness Of Breath and Rash  . Versed [Midazolam] Anaphylaxis  . Vicodin [Hydrocodone-Acetaminophen] Shortness Of Breath and Rash  . Ambien [Zolpidem Tartrate]     Hallucinations     Family History: No family history on file.  Social History:  reports that he quit smoking about 60 years ago. He has quit using smokeless tobacco. He reports that he does not drink alcohol or use drugs.  ROS: UROLOGY Frequent Urination?: Yes Hard to postpone urination?: No Burning/pain with urination?: No Get up at night to urinate?: Yes Leakage of urine?: Yes Urine stream starts and stops?: No Trouble starting stream?: No Do you have to strain to urinate?: No Blood in urine?: Yes Urinary tract infection?: No Sexually transmitted disease?: No Injury to kidneys or bladder?: No Painful intercourse?: No Weak stream?: No Erection problems?: No Penile pain?: No  Gastrointestinal Nausea?: No Vomiting?: No Indigestion/heartburn?: No Diarrhea?: No Constipation?: Yes  Constitutional Fever: No Night sweats?: Yes Weight loss?: No Fatigue?: No  Skin Skin rash/lesions?: No Itching?: No  Eyes Blurred vision?: No Double vision?: No  Ears/Nose/Throat Sore throat?: No Sinus problems?: No  Hematologic/Lymphatic Swollen glands?: No Easy bruising?: Yes  Cardiovascular Leg swelling?: Yes Chest pain?: No  Respiratory Cough?: No Shortness of breath?: No  Endocrine Excessive thirst?: No  Musculoskeletal Back pain?: No Joint pain?: No  Neurological Headaches?: No Dizziness?: No  Psychologic Depression?: No Anxiety?: No  Physical Exam: BP (!) 157/54 (BP Location: Left Arm, Patient Position: Sitting, Cuff  Size: Large)   Pulse 64   Ht 5\' 8"  (1.727 m)   Wt (!) 300 lb 11.2 oz (136.4 kg)   BMI 45.72 kg/m   Constitutional:  Alert and oriented, No acute distress. HEENT: [place] AT, moist mucus membranes.  Trachea midline, no masses. Cardiovascular: No clubbing, cyanosis, or edema.  RRR Respiratory: Normal respiratory effort, no increased work of breathing.  Clear GI: Abdomen is soft, nontender, nondistended, no abdominal masses GU: No CVA tenderness Lymph: No cervical or inguinal lymphadenopathy. Skin: No rashes, bruises or suspicious lesions. Neurologic: Grossly intact, no focal deficits, moving all 4 extremities. Psychiatric: Normal mood and affect.   Assessment & Plan:   85 year old male with high-grade urothelial carcinoma.  Based on endoscopic appearance unlikely it is muscle invasive.  The pathology report was discussed in detail.  Will schedule staging bladder biopsy/repeat TURBT to assess for muscle invasion.  If no evidence of muscle invasion would recommend a 6-week induction BCG.  The procedure was again discussed in detail including potential risks of bleeding, infection, bladder injury/perforation as well as anesthetic risks.  He indicated all questions were answered and desires to proceed.   Abbie Sons, [place] [address] [place], [place] [address] [phone]

## 2018-09-03 ENCOUNTER — Telehealth: Payer: Self-pay | Admitting: Radiology

## 2018-09-03 ENCOUNTER — Other Ambulatory Visit: Payer: Self-pay | Admitting: Radiology

## 2018-09-03 DIAGNOSIS — C679 Malignant neoplasm of bladder, unspecified: Secondary | ICD-10-CM

## 2018-09-03 NOTE — Telephone Encounter (Signed)
Advised daughter, Levada Dy, that aspirin will need to be held for 7 days prior to surgery beginning 10/07/2018. Questions answered. Daughter expresses understanding of instructions.  Daughter requests earlier surgery date if one becomes available. Dr Bernardo Heater wants surgery to take place no sooner than late March.

## 2018-09-08 ENCOUNTER — Encounter: Payer: Self-pay | Admitting: Urology

## 2018-09-24 DIAGNOSIS — C679 Malignant neoplasm of bladder, unspecified: Secondary | ICD-10-CM | POA: Insufficient documentation

## 2018-10-02 ENCOUNTER — Other Ambulatory Visit: Payer: Self-pay

## 2018-10-02 ENCOUNTER — Encounter
Admission: RE | Admit: 2018-10-02 | Discharge: 2018-10-02 | Disposition: A | Discharge status: Home / Self Care | Payer: Medicare Other | Source: Ambulatory Visit | Attending: Urology | Admitting: Urology

## 2018-10-02 NOTE — Patient Instructions (Signed)
Your procedure is scheduled on: Tuesday, October 14, 2018 Report to Day Surgery on the 2nd floor of the [facility]. To find out your arrival time, please call [phone] between 1PM - 3PM on: Monday, October 13, 2018  REMEMBER: Instructions that are not followed completely may result in serious medical risk, up to and including death; or upon the discretion of your surgeon and anesthesiologist your surgery may need to be rescheduled.  Do not eat food after midnight the night before surgery.  No gum chewing, lozengers or hard candies.  You may however, drink water up to 2 hours before you are scheduled to arrive for your surgery. Do not drink anything within 2 hours of the start of your surgery.  No Alcohol for 24 hours before or after surgery.  No Smoking including e-cigarettes for 24 hours prior to surgery.  No chewable tobacco products for at least 6 hours prior to surgery.  No nicotine patches on the day of surgery.  On the morning of surgery brush your teeth with toothpaste and water, you may rinse your mouth with mouthwash if you wish. Do not swallow any toothpaste or mouthwash.  Notify your doctor if there is any change in your medical condition (cold, fever, infection).  Do not wear jewelry, make-up, hairpins, clips or nail polish.  Do not wear lotions, powders, or perfumes.   Do not shave 48 hours prior to surgery.   Contacts and dentures may not be worn into surgery.  Do not bring valuables to the hospital, including drivers license, insurance or credit cards.  [facility] is not responsible for any belongings or valuables.   TAKE THESE MEDICATIONS THE MORNING OF SURGERY:  1.  Amlodipine 2.  Metoprolol 3.  Tamsulosin   Bring your C-PAP to the hospital with you in case you may have to spend the night.   Stop Metformin 2 days prior to surgery. Last day to take is Saturday, April 18; resume after surgery.  Starting April 14 - Stop aspirin and Anti-inflammatories  (NSAIDS) such as Advil, Aleve, Ibuprofen, Motrin, Naproxen, Naprosyn and Aspirin based products such as Excedrin, Goodys Powder, BC Powder. (May take Tylenol or Acetaminophen if needed.)  Stop ANY OVER THE COUNTER supplements until after surgery. (May continue Vitamin D, Vitamin B, and multivitamin.)  Wear comfortable clothing (specific to your surgery type) to the hospital.  If you are being discharged the day of surgery, you will not be allowed to drive home. You will need a responsible adult to drive you home and stay with you that night.   If you are taking public transportation, you will need to have a responsible adult with you. Please confirm with your physician that it is acceptable to use public transportation.   Please call [phone] if you have any questions about these instructions.

## 2018-10-10 ENCOUNTER — Encounter: Payer: Self-pay | Admitting: Urology

## 2018-10-13 MED ORDER — CEFAZOLIN SODIUM-DEXTROSE 2-4 GM/100ML-% IV SOLN
2.0000 g | INTRAVENOUS | Status: AC
  Administered 2018-10-14: 2 g via INTRAVENOUS

## 2018-10-14 ENCOUNTER — Ambulatory Visit: Payer: Medicare Other | Admitting: Anesthesiology

## 2018-10-14 ENCOUNTER — Encounter
Admission: RE | Disposition: A | Discharge status: Home / Self Care | Payer: Self-pay | Source: Home / Self Care | Attending: Urology

## 2018-10-14 ENCOUNTER — Other Ambulatory Visit: Payer: Self-pay

## 2018-10-14 ENCOUNTER — Ambulatory Visit
Admission: RE | Admit: 2018-10-14 | Discharge: 2018-10-14 | Disposition: A | Discharge status: Home / Self Care | Payer: Medicare Other | Attending: Urology | Admitting: Urology

## 2018-10-14 DIAGNOSIS — E669 Obesity, unspecified: Secondary | ICD-10-CM | POA: Insufficient documentation

## 2018-10-14 DIAGNOSIS — G4730 Sleep apnea, unspecified: Secondary | ICD-10-CM | POA: Insufficient documentation

## 2018-10-14 DIAGNOSIS — M069 Rheumatoid arthritis, unspecified: Secondary | ICD-10-CM | POA: Diagnosis not present

## 2018-10-14 DIAGNOSIS — Z9841 Cataract extraction status, right eye: Secondary | ICD-10-CM | POA: Diagnosis not present

## 2018-10-14 DIAGNOSIS — Z87891 Personal history of nicotine dependence: Secondary | ICD-10-CM | POA: Insufficient documentation

## 2018-10-14 DIAGNOSIS — Z6841 Body Mass Index (BMI) 40.0 and over, adult: Secondary | ICD-10-CM | POA: Insufficient documentation

## 2018-10-14 DIAGNOSIS — N189 Chronic kidney disease, unspecified: Secondary | ICD-10-CM | POA: Diagnosis not present

## 2018-10-14 DIAGNOSIS — D631 Anemia in chronic kidney disease: Secondary | ICD-10-CM | POA: Diagnosis not present

## 2018-10-14 DIAGNOSIS — Z96643 Presence of artificial hip joint, bilateral: Secondary | ICD-10-CM | POA: Insufficient documentation

## 2018-10-14 DIAGNOSIS — I129 Hypertensive chronic kidney disease with stage 1 through stage 4 chronic kidney disease, or unspecified chronic kidney disease: Secondary | ICD-10-CM | POA: Insufficient documentation

## 2018-10-14 DIAGNOSIS — Z961 Presence of intraocular lens: Secondary | ICD-10-CM | POA: Insufficient documentation

## 2018-10-14 DIAGNOSIS — Z79899 Other long term (current) drug therapy: Secondary | ICD-10-CM | POA: Insufficient documentation

## 2018-10-14 DIAGNOSIS — Z85820 Personal history of malignant melanoma of skin: Secondary | ICD-10-CM | POA: Insufficient documentation

## 2018-10-14 DIAGNOSIS — N3080 Other cystitis without hematuria: Secondary | ICD-10-CM | POA: Insufficient documentation

## 2018-10-14 DIAGNOSIS — Z9981 Dependence on supplemental oxygen: Secondary | ICD-10-CM | POA: Diagnosis not present

## 2018-10-14 DIAGNOSIS — Z886 Allergy status to analgesic agent status: Secondary | ICD-10-CM | POA: Insufficient documentation

## 2018-10-14 DIAGNOSIS — E1140 Type 2 diabetes mellitus with diabetic neuropathy, unspecified: Secondary | ICD-10-CM | POA: Insufficient documentation

## 2018-10-14 DIAGNOSIS — Z885 Allergy status to narcotic agent status: Secondary | ICD-10-CM | POA: Diagnosis not present

## 2018-10-14 DIAGNOSIS — C679 Malignant neoplasm of bladder, unspecified: Secondary | ICD-10-CM

## 2018-10-14 DIAGNOSIS — Z9842 Cataract extraction status, left eye: Secondary | ICD-10-CM | POA: Insufficient documentation

## 2018-10-14 DIAGNOSIS — C678 Malignant neoplasm of overlapping sites of bladder: Secondary | ICD-10-CM

## 2018-10-14 DIAGNOSIS — J45909 Unspecified asthma, uncomplicated: Secondary | ICD-10-CM | POA: Diagnosis not present

## 2018-10-14 DIAGNOSIS — N400 Enlarged prostate without lower urinary tract symptoms: Secondary | ICD-10-CM | POA: Insufficient documentation

## 2018-10-14 DIAGNOSIS — C673 Malignant neoplasm of anterior wall of bladder: Secondary | ICD-10-CM | POA: Insufficient documentation

## 2018-10-14 DIAGNOSIS — Z7984 Long term (current) use of oral hypoglycemic drugs: Secondary | ICD-10-CM | POA: Diagnosis not present

## 2018-10-14 DIAGNOSIS — Z888 Allergy status to other drugs, medicaments and biological substances status: Secondary | ICD-10-CM | POA: Diagnosis not present

## 2018-10-14 DIAGNOSIS — E1122 Type 2 diabetes mellitus with diabetic chronic kidney disease: Secondary | ICD-10-CM | POA: Diagnosis not present

## 2018-10-14 HISTORY — PX: TRANSURETHRAL RESECTION OF BLADDER TUMOR: SHX2575

## 2018-10-14 HISTORY — PX: CYSTOSCOPY WITH BIOPSY: SHX5122

## 2018-10-14 LAB — GLUCOSE, CAPILLARY
Glucose-Capillary: 161 mg/dL — ABNORMAL HIGH (ref 70–99)
Glucose-Capillary: 156 mg/dL — ABNORMAL HIGH (ref 70–99)

## 2018-10-14 SURGERY — CYSTOSCOPY, WITH BIOPSY
Anesthesia: General

## 2018-10-14 MED ORDER — CEFAZOLIN SODIUM-DEXTROSE 2-4 GM/100ML-% IV SOLN
INTRAVENOUS | Status: AC
  Filled 2018-10-14: qty 100

## 2018-10-14 MED ORDER — FAMOTIDINE 20 MG PO TABS
ORAL_TABLET | ORAL | Status: AC
  Administered 2018-10-14: 06:00:00 20 mg via ORAL
  Filled 2018-10-14: qty 1

## 2018-10-14 MED ORDER — FENTANYL CITRATE (PF) 100 MCG/2ML IJ SOLN
INTRAMUSCULAR | Status: AC
  Filled 2018-10-14: qty 2

## 2018-10-14 MED ORDER — LIDOCAINE HCL (CARDIAC) PF 100 MG/5ML IV SOSY
PREFILLED_SYRINGE | INTRAVENOUS | Status: DC | PRN
  Administered 2018-10-14: 100 mg via INTRAVENOUS

## 2018-10-14 MED ORDER — SUCCINYLCHOLINE CHLORIDE 20 MG/ML IJ SOLN
INTRAMUSCULAR | Status: DC | PRN
  Administered 2018-10-14: 140 mg via INTRAVENOUS

## 2018-10-14 MED ORDER — DEXAMETHASONE SODIUM PHOSPHATE 10 MG/ML IJ SOLN
INTRAMUSCULAR | Status: DC | PRN
  Administered 2018-10-14: 10 mg via INTRAVENOUS

## 2018-10-14 MED ORDER — ROCURONIUM BROMIDE 100 MG/10ML IV SOLN
INTRAVENOUS | Status: DC | PRN
  Administered 2018-10-14 (×2): 10 mg via INTRAVENOUS

## 2018-10-14 MED ORDER — PROPOFOL 10 MG/ML IV BOLUS
INTRAVENOUS | Status: AC
  Filled 2018-10-14: qty 20

## 2018-10-14 MED ORDER — SUGAMMADEX SODIUM 200 MG/2ML IV SOLN
INTRAVENOUS | Status: DC | PRN
  Administered 2018-10-14: 269.8 mg via INTRAVENOUS

## 2018-10-14 MED ORDER — PROMETHAZINE HCL 25 MG/ML IJ SOLN: 6.2500 mg | INTRAMUSCULAR | Status: DC | PRN

## 2018-10-14 MED ORDER — PROPOFOL 10 MG/ML IV BOLUS
INTRAVENOUS | Status: DC | PRN
  Administered 2018-10-14: 180 mg via INTRAVENOUS
  Administered 2018-10-14: 20 mg via INTRAVENOUS

## 2018-10-14 MED ORDER — ONDANSETRON HCL 4 MG/2ML IJ SOLN
INTRAMUSCULAR | Status: DC | PRN
  Administered 2018-10-14: 4 mg via INTRAVENOUS

## 2018-10-14 MED ORDER — FENTANYL CITRATE (PF) 100 MCG/2ML IJ SOLN: 25.0000 ug | INTRAMUSCULAR | Status: DC | PRN

## 2018-10-14 MED ORDER — FENTANYL CITRATE (PF) 100 MCG/2ML IJ SOLN
INTRAMUSCULAR | Status: DC | PRN
  Administered 2018-10-14: 50 ug via INTRAVENOUS
  Administered 2018-10-14 (×2): 25 ug via INTRAVENOUS

## 2018-10-14 MED ORDER — FAMOTIDINE 20 MG PO TABS
20.0000 mg | ORAL_TABLET | Freq: Once | ORAL | Status: AC
  Administered 2018-10-14: 06:00:00 20 mg via ORAL

## 2018-10-14 MED ORDER — SODIUM CHLORIDE 0.9 % IV SOLN
INTRAVENOUS | Status: DC
  Administered 2018-10-14 (×2): via INTRAVENOUS

## 2018-10-14 SURGICAL SUPPLY — 31 items
BAG DRAIN CYSTO-URO LG1000N (MISCELLANEOUS) ×3 IMPLANT
BAG URINE DRAINAGE (UROLOGICAL SUPPLIES) ×3 IMPLANT
BRUSH SCRUB EZ  4% CHG (MISCELLANEOUS)
BRUSH SCRUB EZ 4% CHG (MISCELLANEOUS) IMPLANT
CATH FOL 2WAY LX 16X30 (CATHETERS) ×3 IMPLANT
CATH FOLEY 2WAY  5CC 16FR (CATHETERS)
CATH URTH 16FR FL 2W BLN LF (CATHETERS) IMPLANT
COVER WAND RF STERILE (DRAPES) IMPLANT
DRAPE UTILITY 15X26 TOWEL STRL (DRAPES) ×3 IMPLANT
DRSG TELFA 4X3 1S NADH ST (GAUZE/BANDAGES/DRESSINGS) ×3 IMPLANT
ELECT LOOP 22F BIPOLAR SML (ELECTROSURGICAL)
ELECT REM PT RETURN 9FT ADLT (ELECTROSURGICAL) ×3
ELECTRODE LOOP 22F BIPOLAR SML (ELECTROSURGICAL) IMPLANT
ELECTRODE REM PT RTRN 9FT ADLT (ELECTROSURGICAL) ×1 IMPLANT
GLOVE BIO SURGEON STRL SZ8 (GLOVE) ×3 IMPLANT
GOWN STRL REUS W/ TWL LRG LVL3 (GOWN DISPOSABLE) ×2 IMPLANT
GOWN STRL REUS W/ TWL XL LVL3 (GOWN DISPOSABLE) ×1 IMPLANT
GOWN STRL REUS W/TWL LRG LVL3 (GOWN DISPOSABLE) ×4
GOWN STRL REUS W/TWL XL LVL3 (GOWN DISPOSABLE) ×2
GOWN STRL REUS W/TWL XL LVL4 (GOWN DISPOSABLE) ×3 IMPLANT
KIT TURNOVER CYSTO (KITS) ×3 IMPLANT
LOOP CUT BIPOLAR 24F LRG (ELECTROSURGICAL) IMPLANT
NDL SAFETY ECLIPSE 18X1.5 (NEEDLE) ×1 IMPLANT
NEEDLE HYPO 18GX1.5 SHARP (NEEDLE) ×2
PACK CYSTO AR (MISCELLANEOUS) ×3 IMPLANT
SET CYSTO W/LG BORE CLAMP LF (SET/KITS/TRAYS/PACK) ×3 IMPLANT
SET IRRIG Y TYPE TUR BLADDER L (SET/KITS/TRAYS/PACK) ×3 IMPLANT
SURGILUBE 2OZ TUBE FLIPTOP (MISCELLANEOUS) ×3 IMPLANT
SYRINGE IRR TOOMEY STRL 70CC (SYRINGE) ×3 IMPLANT
WATER STERILE IRR 1000ML POUR (IV SOLUTION) ×3 IMPLANT
WATER STERILE IRR 3000ML UROMA (IV SOLUTION) ×3 IMPLANT

## 2018-10-14 NOTE — Transfer of Care (Signed)
Immediate Anesthesia Transfer of Care Note  Patient: Keith Cain  Procedure(s) Performed: CYSTOSCOPY WITH BLADDER BIOPSY (N/A ) TRANSURETHRAL RESECTION OF BLADDER TUMOR (TURBT) (N/A )  Patient Location: PACU  Anesthesia Type:General  Level of Consciousness: awake, alert  and oriented  Airway & Oxygen Therapy: Patient Spontanous Breathing and Patient connected to face mask oxygen  Post-op Assessment: Report given to RN and Post -op Vital signs reviewed and stable  Post vital signs: Reviewed and stable  Last Vitals:  Vitals Value Taken Time  BP    Temp    Pulse    Resp    SpO2      Last Pain:  Vitals:   10/14/18 0620  TempSrc: Oral  PainSc: 0-No pain         Complications: No apparent anesthesia complications

## 2018-10-14 NOTE — H&P (Signed)
10/14/2018 6:56 AM   Keith Cain Sep 09, 1931 [id number]  Referring provider: No referring provider defined for this encounter.  No chief complaint on file.   HPI: 85 year old male status post TURBT 08/20/2018 for a 4 cm papillary anterior wall bladder tumor.  The tumor was endoscopically superficial appearing.  Pathology returned high-grade urothelial carcinoma suspicious for lamina propria invasion.  No muscularis was present in the specimen.  He presents today for restaging biopsy/possible TURBT   PMH: Past Medical History:  Diagnosis Date  . Acute renal failure ([place])   . Anemia   . Arthritis    RA  . Asthma   . Cancer ([place])    melanoma  . Chronic kidney disease   . Complication of anesthesia    HARD TO WAKE UP PATIENT STATES PCP SAID NO IV SEDATION/ HIGH RISK SINCE HARD TO WAKE UP  . Diabetes mellitus without complication ([place])   . Edema   . Hypertension   . Lung nodules   . Neuropathy   . Oxygen deficiency    WEARS HS  . PONV (postoperative nausea and vomiting)   . Prostate disorder   . Sleep apnea    CPAP    Surgical History: Past Surgical History:  Procedure Laterality Date  . APPENDECTOMY    . BACK SURGERY     lumbar  . CATARACT EXTRACTION W/PHACO Right 08/30/2016   Procedure: CATARACT EXTRACTION PHACO AND INTRAOCULAR LENS PLACEMENT (IOC);  Surgeon: Eulogio Bear, MD;  Location: [facility] ORS;  Service: Ophthalmology;  Laterality: Right;  Lot# [id number] H [place]: 00:55.5 AP%: 11.0 CDE: 6.08  . CATARACT EXTRACTION W/PHACO Left 09/27/2016   Procedure: CATARACT EXTRACTION PHACO AND INTRAOCULAR LENS PLACEMENT (IOC);  Surgeon: Eulogio Bear, MD;  Location: [facility] ORS;  Service: Ophthalmology;  Laterality: Left;  [place] 54.9 AP% 13.4 CDE 7.36 Fluid pack lot # [id number]  . FOREARM SURGERY     RIGHT  . JOINT REPLACEMENT Bilateral    TH X 3  . RCR Right    rotator cuff repair  . TRANSURETHRAL RESECTION OF BLADDER TUMOR N/A 08/20/2018   Procedure: TRANSURETHRAL  RESECTION OF BLADDER TUMOR (TURBT);  Surgeon: Abbie Sons, MD;  Location: [facility] ORS;  Service: Urology;  Laterality: N/A;    Home Medications:   Allergies:  Allergies  Allergen Reactions  . Codeine Shortness Of Breath and Rash  . Dilaudid [Hydromorphone Hcl] Other (See Comments)    Stopped breathing  . Morphine And Related Shortness Of Breath and Rash  . Percocet [Oxycodone-Acetaminophen] Shortness Of Breath and Rash  . Versed [Midazolam] Anaphylaxis  . Vicodin [Hydrocodone-Acetaminophen] Shortness Of Breath and Rash  . Ambien [Zolpidem Tartrate]     Hallucinations    acetaminophen 500 MG tablet Commonly known as:  TYLENOL Take 500 mg by mouth every 8 (eight) hours as needed for moderate pain or headache.   amLODipine 5 MG tablet Commonly known as:  NORVASC Take 5 mg by mouth 2 (two) times daily.   aspirin EC 81 MG tablet Take 81 mg by mouth daily.   atorvastatin 10 MG tablet Commonly known as:  LIPITOR Take 10 mg by mouth at bedtime.   colchicine 0.6 MG tablet Take 0.6 mg by mouth 2 (two) times daily as needed.   diclofenac sodium 1 % Gel Commonly known as:  VOLTAREN Apply 2 g topically 4 (four) times daily. Apply three times a day over the R elbow   furosemide 20 MG tablet Commonly known as:  LASIX Take 20  mg by mouth daily.   glimepiride 1 MG tablet Commonly known as:  AMARYL Take 1 mg by mouth daily.   losartan 100 MG tablet Commonly known as:  COZAAR Take 100 mg by mouth daily.   metFORMIN 500 MG tablet Commonly known as:  GLUCOPHAGE Take 1,000 mg by mouth 2 (two) times daily with a meal.   metoprolol succinate 50 MG 24 hr tablet Commonly known as:  TOPROL-XL Take 50 mg by mouth daily.   tamsulosin 0.4 MG Caps capsule Commonly known as:  FLOMAX Take 0.4 mg by mouth daily.   vitamin B-12 1000 MCG tablet Commonly known as:  CYANOCOBALAMIN Take 1,000 mcg by mouth daily.      Family History: History reviewed. No pertinent  family history.  Social History:  reports that he quit smoking about 60 years ago. He has quit using smokeless tobacco. He reports that he does not drink alcohol or use drugs.  ROS: No significant change from 08/20/2018  Physical Exam: BP (!) 142/65   Pulse (!) 55   Temp 97.6 F (36.4 C) (Oral)   Resp 18   Ht 5\' 8"  (1.727 m)   Wt 134.9 kg   SpO2 97%   BMI 45.23 kg/m   Constitutional:  Alert and oriented, No acute distress. HEENT: [place] AT, moist mucus membranes.  Trachea midline, no masses. Cardiovascular: No clubbing, cyanosis, or edema.  RRR Respiratory: Normal respiratory effort, no increased work of breathing.  Clear GI: Abdomen is soft, nontender, nondistended, no abdominal masses GU: No CVA tenderness Lymph: No cervical or inguinal lymphadenopathy. Skin: No rashes, bruises or suspicious lesions. Neurologic: Grossly intact, no focal deficits, moving all 4 extremities. Psychiatric: Normal mood and affect.   Assessment & Plan:   85 year old male with high-grade urothelial carcinoma the bladder who presents for restaging biopsy/possible TURBT.  The procedure has been discussed in detail, including potential risks of bleeding, infection, bladder perforation as well as anesthetic risks.  He indicated all questions were answered and desires to proceed.   Abbie Sons, [place] [address] [place], [place] [address] [phone]

## 2018-10-14 NOTE — Anesthesia Post-op Follow-up Note (Signed)
Anesthesia QCDR form completed.        

## 2018-10-14 NOTE — Anesthesia Postprocedure Evaluation (Signed)
Anesthesia Post Note  Patient: Keith Cain  Procedure(s) Performed: CYSTOSCOPY WITH BLADDER BIOPSY (N/A ) TRANSURETHRAL RESECTION OF BLADDER TUMOR (TURBT) (N/A )  Patient location during evaluation: PACU Anesthesia Type: General Level of consciousness: awake and alert and oriented Pain management: pain level controlled Vital Signs Assessment: post-procedure vital signs reviewed and stable Respiratory status: spontaneous breathing, nonlabored ventilation and respiratory function stable Cardiovascular status: blood pressure returned to baseline and stable Postop Assessment: no signs of nausea or vomiting Anesthetic complications: no     Last Vitals:  Vitals:   10/14/18 0847 10/14/18 0905  BP: 128/60 (!) 153/59  Pulse: (!) 50 (!) 48  Resp: 13 15  Temp: (!) 36.3 C (!) 36.4 C  SpO2: 96% 96%    Last Pain:  Vitals:   10/14/18 0905  TempSrc: Temporal  PainSc: 1                  Macdonald Rigor

## 2018-10-14 NOTE — Anesthesia Procedure Notes (Signed)
Procedure Name: Intubation Date/Time: 10/14/2018 7:48 AM Performed by: Nelda Marseille, CRNA Pre-anesthesia Checklist: Patient identified, Patient being monitored, Timeout performed, Emergency Drugs available and Suction available Patient Re-evaluated:Patient Re-evaluated prior to induction Oxygen Delivery Method: Circle system utilized Preoxygenation: Pre-oxygenation with 100% oxygen Induction Type: IV induction Ventilation: Mask ventilation without difficulty Laryngoscope Size: Mac and 3 Grade View: Grade III Tube type: Oral Tube size: 7.0 mm Number of attempts: 1 Airway Equipment and Method: Stylet Placement Confirmation: ETT inserted through vocal cords under direct vision,  positive ETCO2 and breath sounds checked- equal and bilateral Secured at: 21 cm Tube secured with: Tape Dental Injury: Teeth and Oropharynx as per pre-operative assessment

## 2018-10-14 NOTE — Op Note (Signed)
Preoperative diagnosis:  1. Urothelial carcinoma bladder  Postoperative diagnosis:  1. Urothelial carcinoma bladder  Procedure: 1. Cystoscopy with bladder biopsies  Surgeon: Abbie Sons, MD  Anesthesia: General  Complications: None  Intraoperative findings:  Previous resection site healed with minimal erythema.  No visible tumor  EBL: Minimal  Specimens: Bladder biopsies  Indication: Keith Cain is a 85 y.o. male status post TURBT 08/20/2018 for a 4 cm anterior bladder wall tumor.  Endoscopically the tumor was superficial in appearance.  Pathology returned high-grade urothelial carcinoma with possible lamina propria invasion.  No muscle was seen in the specimen.  He presents for follow-up bladder biopsies versus reresection.  After reviewing the management options for treatment, he elected to proceed with the above surgical procedure(s). We have discussed the potential benefits and risks of the procedure, side effects of the proposed treatment, the likelihood of the patient achieving the goals of the procedure, and any potential problems that might occur during the procedure or recuperation. Informed consent has been obtained.  Description of procedure:  The patient was taken to the operating room and general anesthesia was induced.  The patient was placed in the dorsal lithotomy position, prepped and draped in the usual sterile fashion, and preoperative antibiotics were administered. A preoperative time-out was performed.   A 21 French cystoscope was lubricated and passed under direct vision.  The urethra was normal in caliber without stricture.  Prostate demonstrated moderate lateral lobe enlargement without lesions.  Panendoscopy was performed and the bladder mucosa showed no solid or papillary lesions.  At the prior resection site was minimal erythema with a mild hypervascularity. No other mucosal abnormalities were noted.  Based on the appearance it was elected to obtain cold  bladder biopsies of the resection site in lieu of reresection.  Using cold cup biopsy forceps biopsies x2 were obtained.  The site was fulgurated with a Bugbee electrode.  Hemostasis was noted to be adequate.  A 16 French Foley catheter was placed with return of pink-tinged effluent on irrigation.  Plan: Catheter may be removed on 10/16/2018.  He will be notified with the pathology results and will be scheduled for BCG induction therapy in the near future.   Abbie Sons, M.D.

## 2018-10-14 NOTE — Anesthesia Preprocedure Evaluation (Signed)
Anesthesia Evaluation  Patient identified by MRN, date of birth, ID band Patient awake    Reviewed: Allergy & Precautions, NPO status , Patient's Chart, lab work & pertinent test results  History of Anesthesia Complications (+) PONV and history of anesthetic complications  Airway Mallampati: I  TM Distance: >3 FB Neck ROM: Full    Dental  (+) Edentulous Upper, Edentulous Lower   Pulmonary asthma , sleep apnea and Continuous Positive Airway Pressure Ventilation , former smoker,    breath sounds clear to auscultation- rhonchi (-) wheezing      Cardiovascular hypertension, Pt. on medications (-) CAD, (-) Past MI, (-) Cardiac Stents and (-) CABG  Rhythm:Regular Rate:Normal - Systolic murmurs and - Diastolic murmurs    Neuro/Psych neg Seizures negative neurological ROS  negative psych ROS   GI/Hepatic negative GI ROS, Neg liver ROS,   Endo/Other  diabetes, Oral Hypoglycemic Agents  Renal/GU negative Renal ROS     Musculoskeletal  (+) Arthritis ,   Abdominal (+) + obese,   Peds  Hematology  (+) anemia ,   Anesthesia Other Findings Past Medical History: No date: Acute renal failure (HCC) No date: Anemia No date: Arthritis     Comment:  RA No date: Asthma No date: Cancer ([place])     Comment:  melanoma No date: Chronic kidney disease No date: Complication of anesthesia     Comment:  HARD TO WAKE UP PATIENT STATES PCP SAID NO IV SEDATION/               HIGH RISK SINCE HARD TO WAKE UP No date: Diabetes mellitus without complication (HCC) No date: Edema No date: Hypertension No date: Lung nodules No date: Neuropathy No date: Oxygen deficiency     Comment:  WEARS HS No date: PONV (postoperative nausea and vomiting) No date: Prostate disorder No date: Sleep apnea     Comment:  CPAP   Reproductive/Obstetrics                             Anesthesia Physical Anesthesia Plan  ASA:  III  Anesthesia Plan: General   Post-op Pain Management:    Induction: Intravenous  PONV Risk Score and Plan: 2 and Ondansetron and Dexamethasone  Airway Management Planned: Oral ETT  Additional Equipment:   Intra-op Plan:   Post-operative Plan: Extubation in OR  Informed Consent: I have reviewed the patients History and Physical, chart, labs and discussed the procedure including the risks, benefits and alternatives for the proposed anesthesia with the patient or authorized representative who has indicated his/her understanding and acceptance.     Dental advisory given  Plan Discussed with: CRNA and Anesthesiologist  Anesthesia Plan Comments:         Anesthesia Quick Evaluation

## 2018-10-14 NOTE — Discharge Instructions (Addendum)
AMBULATORY SURGERY  DISCHARGE INSTRUCTIONS   1) The drugs that you were given will stay in your system until tomorrow so for the next 24 hours you should not:  A) Drive an automobile B) Make any legal decisions C) Drink any alcoholic beverage   2) You may resume regular meals tomorrow.  Today it is better to start with liquids and gradually work up to solid foods.  You may eat anything you prefer, but it is better to start with liquids, then soup and crackers, and gradually work up to solid foods.  3) Please notify your doctor immediately if you have any unusual bleeding, trouble breathing, redness and pain at the surgery site, drainage, fever, or pain not relieved by medication.  4) Additional Instructions:  [place]. Be careful not to pull on foley catheter.  Expect bloody urine and blood clots in urine.  This should decrease over the next few days.  Your daughter is to remove the catheter on Thursday.  If you run a fever or have increased pain, please call Dr. Dagoberto Reef office.  You may take Tylenol/Ibuprofen if uncomfortable.  Please make sure that the foley bag remains below the level of your bladder and that it does not hang on the floor.  Please contact your physician with any problems or Same Day Surgery at [phone], Monday through Friday 6 am to 4   pm, or [place] at [facility] number at [phone].     Bladder Biopsy   Definition:  Bladder biopsy is a surgical procedure used to diagnose and remove tumors within the bladder.   General instructions:     Your recent bladder surgery requires very little post hospital care but some definite precautions.  Despite the fact that no skin incisions were used, the area around the bladder incisions are raw and covered with scabs to promote healing and prevent bleeding. Certain precautions are needed to insure that the scabs are not disturbed over the next 2-4 weeks while the healing proceeds.  Because the raw  surface inside your bladder and the irritating effects of urine you may expect frequency of urination and/or urgency (a stronger desire to urinate) and perhaps even getting up at night more often. This will usually resolve or improve slowly over the healing period. You may see some blood in your urine over the first 6 weeks. Do not be alarmed, even if the urine was clear for a while. Get off your feet and drink lots of fluids until clearing occurs. If you start to pass clots or don't improve call us.  Diet:  You may return to your normal diet immediately. Because of the raw surface of your bladder, alcohol, spicy foods, foods high in acid and drinks with caffeine may cause irritation or frequency and should be used in moderation. To keep your urine flowing freely and avoid constipation, drink plenty of fluids during the day (8-10 glasses). Tip: Avoid cranberry juice because it is very acidic.  Activity:  Your physical activity doesn't need to be restricted. However, if you are very active, you may see some blood in the urine. We suggest that you reduce your activity under the circumstances until the bleeding has stopped.  Bowels:  It is important to keep your bowels regular during the postoperative period. Straining with bowel movements can cause bleeding. A bowel movement every other day is reasonable. Use a mild laxative if needed, such as milk of magnesia 2-3 tablespoons, or 2 Dulcolax tablets. Call if you  continue to have problems. If you had been taking narcotics for pain, before, during or after your surgery, you may be constipated. Take a laxative if necessary.    Medication:  You should resume your pre-surgery medications unless told not to. In addition you may be given an antibiotic to prevent or treat infection. Antibiotics are not always necessary. All medication should be taken as prescribed until the bottles are finished unless you are having an unusual reaction to one of the  drugs.   [place] [address] [place],  [address] [phone]

## 2018-10-14 NOTE — Interval H&P Note (Signed)
History and Physical Interval Note:  10/14/2018 7:10 AM  Keith Cain  has presented today for surgery, with the diagnosis of Bladder Cancer.  The various methods of treatment have been discussed with the patient and family. After consideration of risks, benefits and other options for treatment, the patient has consented to  Procedure(s): CYSTOSCOPY WITH BLADDER BIOPSY (N/A) TRANSURETHRAL RESECTION OF BLADDER TUMOR (TURBT) (N/A) as a surgical intervention.  The patient's history has been reviewed, patient examined, no change in status, stable for surgery.  I have reviewed the patient's chart and labs.  Questions were answered to the patient's satisfaction.     [place]

## 2018-10-15 ENCOUNTER — Telehealth: Payer: Self-pay

## 2018-10-15 LAB — URINE CULTURE: Culture: <10000 — AB

## 2018-10-15 LAB — SURGICAL PATHOLOGY

## 2018-10-15 NOTE — Telephone Encounter (Signed)
-----   Message from Abbie Sons, MD sent at 10/15/2018  3:00 PM EDT ----- Please let patient know bladder biopsy showed no evidence of cancer.  He needs to be set up for a 6-week course of BCG once we are able to start administering again.

## 2018-10-15 NOTE — Telephone Encounter (Signed)
mychart notification sent 

## 2018-10-16 ENCOUNTER — Telehealth: Payer: Self-pay

## 2018-10-16 ENCOUNTER — Encounter: Payer: Self-pay | Admitting: Urology

## 2018-10-16 NOTE — Telephone Encounter (Signed)
Patient sent my chart this morning :  Good morning. I mentioned the biopsy and strep to your CMA and wondered if it would need treatment and I'm writing this morning to let you know that I'm hurting all across the top of my chest into my shoulder, arms and neck. It hurts to deep breathe. Any idea what caused that as it wasn't like this the last time ?   Thanks so much   Spoke with patient per Dr. Bernardo Heater patient should go to the ED for further evaluation.  Contacted patient by phone and notified him to go to the ED for further evaluation

## 2018-10-29 ENCOUNTER — Other Ambulatory Visit: Payer: Self-pay

## 2018-10-29 ENCOUNTER — Telehealth (INDEPENDENT_AMBULATORY_CARE_PROVIDER_SITE_OTHER): Payer: Medicare Other | Admitting: Urology

## 2018-10-29 DIAGNOSIS — C679 Malignant neoplasm of bladder, unspecified: Secondary | ICD-10-CM

## 2018-10-29 NOTE — Progress Notes (Signed)
Virtual Visit via Video Note  I connected with Keith Cain on 10/29/18 at  1:00 PM EDT by a video enabled telemedicine application and verified that I am speaking with the correct person using two identifiers.  Location: Patient: Home Provider: Private home office   I discussed the limitations of evaluation and management by telemedicine and the availability of in person appointments. The patient expressed understanding and agreed to proceed.  History of Present Illness: I contacted Mr. Spratlin for a postop follow-up via video due to COVID-19 pandemic.  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  Initial TURBT in February 2020 showed high-grade cancer suspicious for lamina propria invasion.  No muscle was seen.  This was an anterior located tumor.  He underwent follow-up biopsies on 10/14/2018.Marland Kitchen  He had no postoperative problems.  Intraoperative findings remarkable for complete healing of the resection site and no tumor seen.  Cold biopsies were obtained which returned negative for cancer.   Observations/Objective: Alert, in no acute distress  Assessment and Plan: 85 year old male with high-grade NMIBC.  I recommended 6-week induction BCG.  I discussed BCG as a form of immunotherapy.  The most common side effects of irritative voiding symptoms were discussed.  The possibility of flulike symptoms managed with Tylenol was also reviewed.  Rare instances of BCG sepsis with death were also discussed although he was informed that the majority of these cases were due to gross blood in the urine at the time of administration.  He states he has difficulty holding his urine and may need to stay in office with an indwelling catheter.  All questions were answered and he desires to schedule.  Follow Up Instructions: He will be contacted regarding BCG scheduling.   I discussed the assessment and treatment plan with the patient. The patient was provided an opportunity to ask  questions and all were answered. The patient agreed with the plan and demonstrated an understanding of the instructions.   The patient was advised to call back or seek an in-person evaluation if the symptoms worsen or if the condition fails to improve as anticipated.  I provided 10 minutes of non-face-to-face time during this encounter.   Abbie Sons, MD

## 2018-11-02 NOTE — Progress Notes (Signed)
BCG Bladder Instillation  BCG # 1  Due to Bladder Cancer patient is present today for a BCG treatment. Patient was cleaned and prepped in a sterile fashion with betadine and lidocaine 2% jelly was instilled into the urethra.  A 16 FR catheter was inserted, urine return was noted 100 ml, urine was yellow in color.  45ml of reconstituted BCG was instilled into the bladder. The catheter was plugged.  The catheter was then removed. Patient tolerated well, no complications were noted  Performed by: Zara Council, PA-C and Gordy Clement, CMA  Follow up/ Additional notes: RTC in one week for BCG

## 2018-11-03 ENCOUNTER — Ambulatory Visit (INDEPENDENT_AMBULATORY_CARE_PROVIDER_SITE_OTHER): Payer: Medicare Other | Admitting: Urology

## 2018-11-03 ENCOUNTER — Other Ambulatory Visit: Payer: Self-pay

## 2018-11-03 ENCOUNTER — Encounter: Payer: Self-pay | Admitting: Urology

## 2018-11-03 VITALS — BP 146/80 | HR 90 | Ht 68.0 in

## 2018-11-03 DIAGNOSIS — C679 Malignant neoplasm of bladder, unspecified: Secondary | ICD-10-CM

## 2018-11-03 LAB — MICROSCOPIC EXAMINATION
Bacteria, UA: NONE SEEN
Epithelial Cells (non renal): NONE SEEN /hpf (ref 0–10)
RBC: NONE SEEN /hpf (ref 0–2)

## 2018-11-03 LAB — URINALYSIS, COMPLETE
Bilirubin, UA: NEGATIVE
Glucose, UA: NEGATIVE
Ketones, UA: NEGATIVE
Leukocytes,UA: NEGATIVE
Nitrite, UA: NEGATIVE
Specific Gravity, UA: 1.025 (ref 1.005–1.030)
Urobilinogen, Ur: 0.2 mg/dL (ref 0.2–1.0)
pH, UA: 5.5 (ref 5.0–7.5)

## 2018-11-03 MED ORDER — BCG LIVE 50 MG IS SUSR
2.0000 mL | Freq: Once | INTRAVESICAL | Status: AC
  Administered 2018-11-03: 50 mg via INTRAVESICAL

## 2018-11-09 NOTE — Progress Notes (Addendum)
BCG Bladder Instillation  BCG # 2  Due to Bladder Cancer patient is present today for a BCG treatment. Patient was cleaned and prepped in a sterile fashion with betadine and lidocaine 2% jelly was instilled into the urethra.  A 16 FR catheter was inserted, urine return was noted 100 ml, urine was yellow in color.  24ml of reconstituted BCG was instilled into the bladder. The catheter was plugged and patient returned to home.  Mr. Keith Cain is instructed on how to remove the catheter and dispose of it in a biohazard bag to return to our office.  Patient tolerated well, no complications were noted  Performed by: Zara Council, PA-C and Royanne Foots, CMA  Follow up/ Additional notes: RTC in one week for BCG

## 2018-11-10 ENCOUNTER — Encounter: Payer: Self-pay | Admitting: Urology

## 2018-11-10 ENCOUNTER — Ambulatory Visit (INDEPENDENT_AMBULATORY_CARE_PROVIDER_SITE_OTHER): Payer: Medicare Other | Admitting: Urology

## 2018-11-10 ENCOUNTER — Other Ambulatory Visit: Payer: Self-pay

## 2018-11-10 VITALS — BP 179/72 | HR 60 | Ht 68.0 in | Wt 300.0 lb

## 2018-11-10 DIAGNOSIS — C679 Malignant neoplasm of bladder, unspecified: Secondary | ICD-10-CM

## 2018-11-10 LAB — MICROSCOPIC EXAMINATION
Bacteria, UA: NONE SEEN
RBC, Urine: NONE SEEN /hpf (ref 0–2)

## 2018-11-10 LAB — URINALYSIS, COMPLETE
Bilirubin, UA: NEGATIVE
Glucose, UA: NEGATIVE
Ketones, UA: NEGATIVE
Leukocytes,UA: NEGATIVE
Nitrite, UA: NEGATIVE
Specific Gravity, UA: 1.025 (ref 1.005–1.030)
Urobilinogen, Ur: 0.2 mg/dL (ref 0.2–1.0)
pH, UA: 5 (ref 5.0–7.5)

## 2018-11-10 MED ORDER — BCG LIVE 50 MG IS SUSR
3.2400 mL | Freq: Once | INTRAVESICAL | Status: AC
  Administered 2018-11-10: 81 mg via INTRAVESICAL

## 2018-11-10 NOTE — Patient Instructions (Signed)

## 2018-11-24 ENCOUNTER — Other Ambulatory Visit: Payer: Self-pay

## 2018-11-24 ENCOUNTER — Ambulatory Visit (INDEPENDENT_AMBULATORY_CARE_PROVIDER_SITE_OTHER): Payer: Medicare Other | Admitting: Family Medicine

## 2018-11-24 DIAGNOSIS — C679 Malignant neoplasm of bladder, unspecified: Secondary | ICD-10-CM

## 2018-11-24 LAB — MICROSCOPIC EXAMINATION
Bacteria, UA: NONE SEEN
RBC, Urine: NONE SEEN /hpf (ref 0–2)

## 2018-11-24 LAB — URINALYSIS, COMPLETE
Bilirubin, UA: NEGATIVE
Glucose, UA: NEGATIVE
Ketones, UA: NEGATIVE
Leukocytes,UA: NEGATIVE
Nitrite, UA: NEGATIVE
RBC, UA: NEGATIVE
Specific Gravity, UA: 1.025 (ref 1.005–1.030)
Urobilinogen, Ur: 0.2 mg/dL (ref 0.2–1.0)
pH, UA: 5 (ref 5.0–7.5)

## 2018-11-24 MED ORDER — BCG LIVE 50 MG IS SUSR
3.2400 mL | Freq: Once | INTRAVESICAL | Status: AC
  Administered 2018-11-24: 81 mg via INTRAVESICAL

## 2018-11-24 NOTE — Progress Notes (Signed)
BCG Bladder Instillation  BCG # 3  Due to Bladder Cancer patient is present today for a BCG treatment. Patient was cleaned and prepped in a sterile fashion with betadine and lidocaine 2% jelly was instilled into the urethra.  A 16FR coude catheter was inserted, the balloon was filled with 10cc of sterile water. A plug was placed on the end. Urine return was noted 42ml, urine was yellow in color.  48ml of reconstituted BCG was instilled into the bladder. The catheter was then removed. Patient tolerated well, no complications were noted  Preformed by: Elberta Leatherwood, [place], Gordy Clement, [place]  Follow up/ Additional notes: 1 week

## 2018-11-30 NOTE — Progress Notes (Signed)
BCG Bladder Instillation  BCG # 4  Due to Bladder Cancer patient is present today for a BCG treatment. Patient was cleaned and prepped in a sterile fashion with betadine and lidocaine 2% jelly was instilled into the urethra.  A 16 FR catheter was inserted, urine return was noted 100 ml, urine was yellow in color.  99ml of reconstituted BCG was instilled into the bladder. The catheter was then plugged.  Patient tolerated well, no complications were noted  Performed by: Zara Council, PA-C and Kyra Manges, CMA  Follow up/ Additional notes: follow up in one week

## 2018-12-01 ENCOUNTER — Ambulatory Visit (INDEPENDENT_AMBULATORY_CARE_PROVIDER_SITE_OTHER): Payer: Medicare Other | Admitting: Urology

## 2018-12-01 ENCOUNTER — Encounter: Payer: Self-pay | Admitting: Urology

## 2018-12-01 ENCOUNTER — Other Ambulatory Visit: Payer: Self-pay

## 2018-12-01 DIAGNOSIS — C679 Malignant neoplasm of bladder, unspecified: Secondary | ICD-10-CM

## 2018-12-01 LAB — URINALYSIS, COMPLETE
Bilirubin, UA: NEGATIVE
Glucose, UA: NEGATIVE
Ketones, UA: NEGATIVE
Nitrite, UA: NEGATIVE
RBC, UA: NEGATIVE
Specific Gravity, UA: 1.02 (ref 1.005–1.030)
Urobilinogen, Ur: 0.2 mg/dL (ref 0.2–1.0)
pH, UA: 5 (ref 5.0–7.5)

## 2018-12-01 LAB — MICROSCOPIC EXAMINATION
Bacteria, UA: NONE SEEN
RBC: NONE SEEN /hpf (ref 0–2)

## 2018-12-01 MED ORDER — BCG LIVE 50 MG IS SUSR
3.2400 mL | Freq: Once | INTRAVESICAL | Status: AC
  Administered 2018-12-01: 81 mg via INTRAVESICAL

## 2018-12-07 NOTE — Progress Notes (Signed)
BCG Bladder Instillation  BCG # 5  Due to Bladder Cancer patient is present today for a BCG treatment. Patient was cleaned and prepped in a sterile fashion with betadine and lidocaine 2% jelly was instilled into the urethra.  A 16 FR catheter was inserted, urine return was noted 70 ml, urine was yellow in color.  67ml of reconstituted BCG was instilled into the bladder. The catheter was then plugged.  Patient tolerated well, no complications were noted  Performed by: Zara Council, PA-C and Kyra Manges, CMA  Follow up/ Additional notes: follow up in one week

## 2018-12-08 ENCOUNTER — Ambulatory Visit (INDEPENDENT_AMBULATORY_CARE_PROVIDER_SITE_OTHER): Payer: Medicare Other | Admitting: Urology

## 2018-12-08 ENCOUNTER — Other Ambulatory Visit: Payer: Self-pay

## 2018-12-08 DIAGNOSIS — C679 Malignant neoplasm of bladder, unspecified: Secondary | ICD-10-CM | POA: Diagnosis not present

## 2018-12-08 MED ORDER — BCG LIVE 50 MG IS SUSR
3.2400 mL | Freq: Once | INTRAVESICAL | Status: AC
  Administered 2018-12-08: 81 mg via INTRAVESICAL

## 2018-12-10 LAB — URINALYSIS, COMPLETE
Bilirubin, UA: NEGATIVE
Glucose, UA: NEGATIVE
Leukocytes,UA: NEGATIVE
Nitrite, UA: NEGATIVE
Specific Gravity, UA: 1.025 (ref 1.005–1.030)
Urobilinogen, Ur: 0.2 mg/dL (ref 0.2–1.0)
pH, UA: 5.5 (ref 5.0–7.5)

## 2018-12-10 LAB — MICROSCOPIC EXAMINATION: Bacteria, UA: NONE SEEN

## 2018-12-15 ENCOUNTER — Encounter: Payer: Self-pay | Admitting: Urology

## 2018-12-15 ENCOUNTER — Ambulatory Visit (INDEPENDENT_AMBULATORY_CARE_PROVIDER_SITE_OTHER): Payer: Medicare Other | Admitting: Family Medicine

## 2018-12-15 ENCOUNTER — Other Ambulatory Visit: Payer: Self-pay

## 2018-12-15 DIAGNOSIS — C679 Malignant neoplasm of bladder, unspecified: Secondary | ICD-10-CM | POA: Diagnosis not present

## 2018-12-15 LAB — URINALYSIS, COMPLETE
Bilirubin, UA: NEGATIVE
Glucose, UA: NEGATIVE
Ketones, UA: NEGATIVE
Leukocytes,UA: NEGATIVE
Nitrite, UA: NEGATIVE
Specific Gravity, UA: 1.025 (ref 1.005–1.030)
Urobilinogen, Ur: 0.2 mg/dL (ref 0.2–1.0)
pH, UA: 5 (ref 5.0–7.5)

## 2018-12-15 LAB — MICROSCOPIC EXAMINATION
Bacteria, UA: NONE SEEN
RBC: NONE SEEN /hpf (ref 0–2)

## 2018-12-15 MED ORDER — BCG LIVE 50 MG IS SUSR
3.2400 mL | Freq: Once | INTRAVESICAL | Status: AC
  Administered 2018-12-15: 81 mg via INTRAVESICAL

## 2018-12-15 NOTE — Progress Notes (Signed)
BCG Bladder Instillation  BCG # 6  Due to Bladder Cancer patient is present today for a BCG treatment. Patient was cleaned and prepped in a sterile fashion with betadine and lidocaine 2% jelly was instilled into the urethra.  A 16FR catheter was inserted, urine return was noted *18ml, urine was yellow in color 27ml of sterile water was administered in the catheter. A plug was placed on the end of the catheter.  68ml of reconstituted BCG was instilled into the bladder. Patient was sent home with catheter and a 55ml syringe to remove the water. Patient tolerated well, no complications were noted  Preformed by: Elberta Leatherwood, CMA  Follow up/ Additional notes: 3 months foe Cysto

## 2019-02-20 ENCOUNTER — Encounter: Payer: Self-pay | Admitting: Urology

## 2019-03-17 ENCOUNTER — Other Ambulatory Visit: Payer: Medicare Other | Admitting: Urology

## 2019-03-18 ENCOUNTER — Other Ambulatory Visit: Payer: Medicare Other | Admitting: Urology

## 2019-06-28 ENCOUNTER — Telehealth: Payer: Self-pay | Admitting: Urology

## 2019-06-28 NOTE — Telephone Encounter (Signed)
I think Keith Cain cancelled his surveillance cystoscopy due to him having COVID-19.  We need to get him rescheduled for his cystoscopy.

## 2019-06-29 NOTE — Telephone Encounter (Signed)
I called and spoke with pt's Daughter states that he doesn't want to do the Cysto.

## 2019-07-06 ENCOUNTER — Encounter: Payer: Self-pay | Admitting: Urology

## 2020-07-16 IMAGING — CT CT RENAL STONE PROTOCOL
2 of 5 series · 15 of 46 positions shown, 17 images · non-contrast
Comparison: Prior CT scan of the abdomen and pelvis 10/18/2017

CLINICAL DATA: 86-year-old male with hematuria, unable to void and
back pain. Known history of renal stones.

EXAM:
CT ABDOMEN AND PELVIS WITHOUT CONTRAST
TECHNIQUE: Multidetector CT imaging of the abdomen and pelvis was performed
following the standard protocol without IV contrast.

[Series 2: stone full standard · axial · 0.98mm/px · z∈[-491,-46]mm · 12 of 101 slices shown, 14 images]
[im 6/101  soft-tissue]
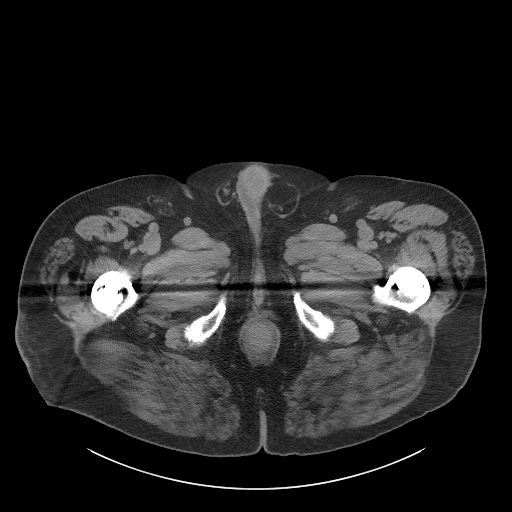
[im 6/101  bone]
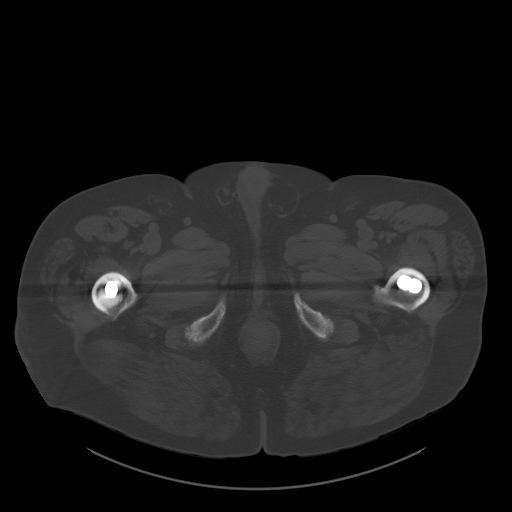
[im 16/101  soft-tissue]
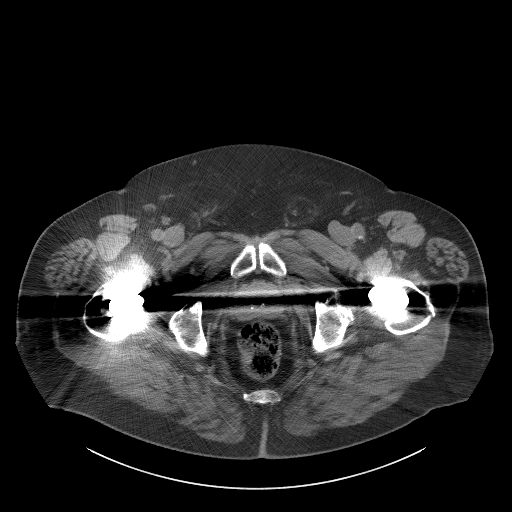
[im 22/101  soft-tissue]
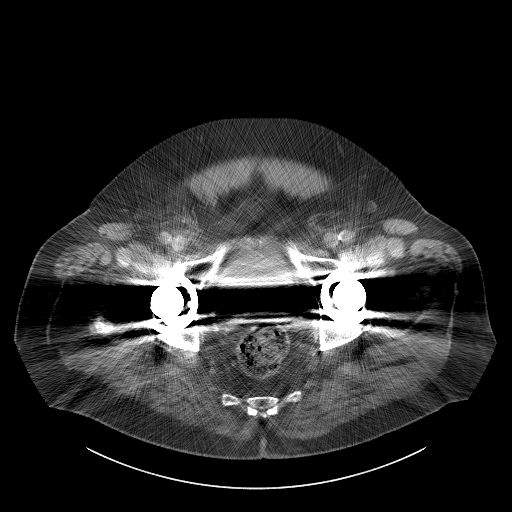
[im 32/101  soft-tissue]
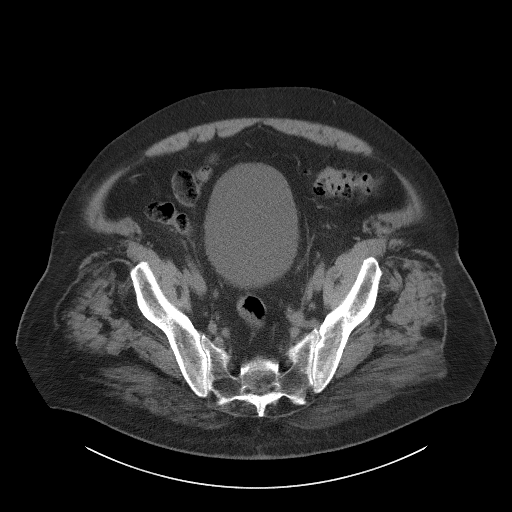
[im 37/101  soft-tissue]
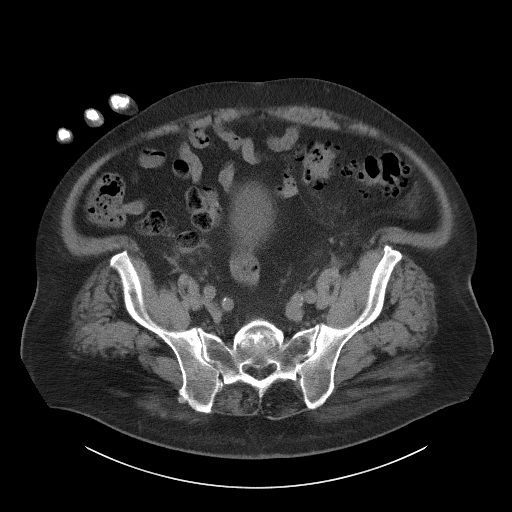
[im 48/101  soft-tissue]
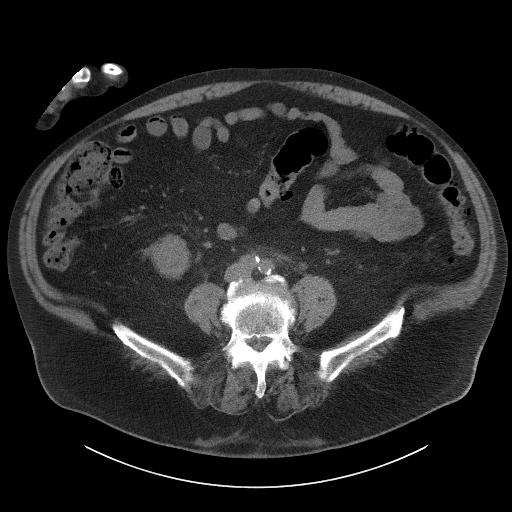
[im 53/101  soft-tissue]
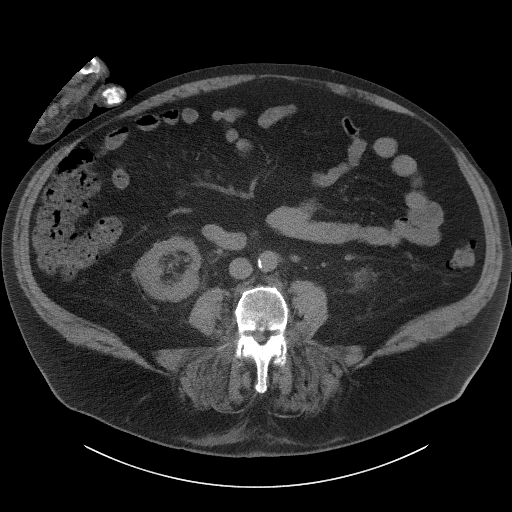
[im 64/101  soft-tissue]
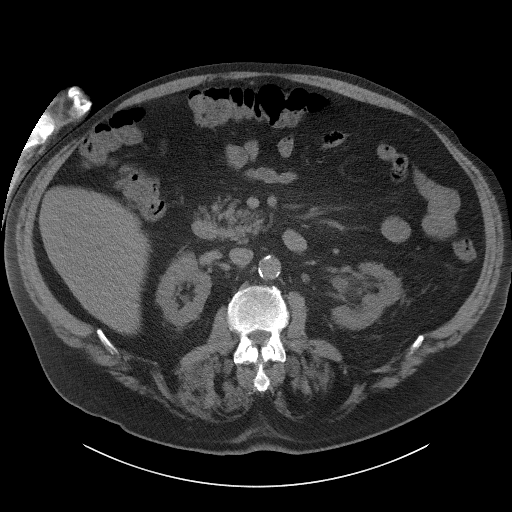
[im 69/101  soft-tissue]
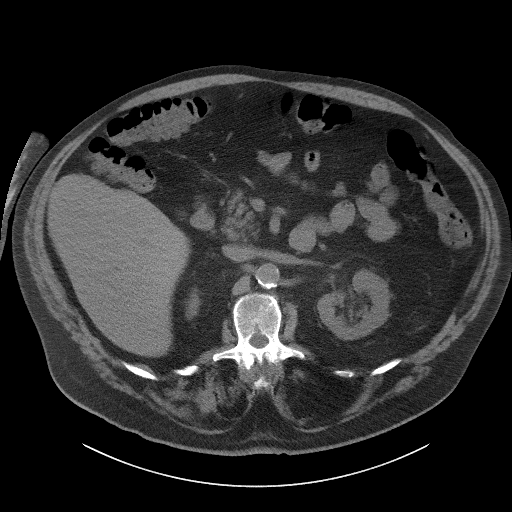
[im 69/101  bone]
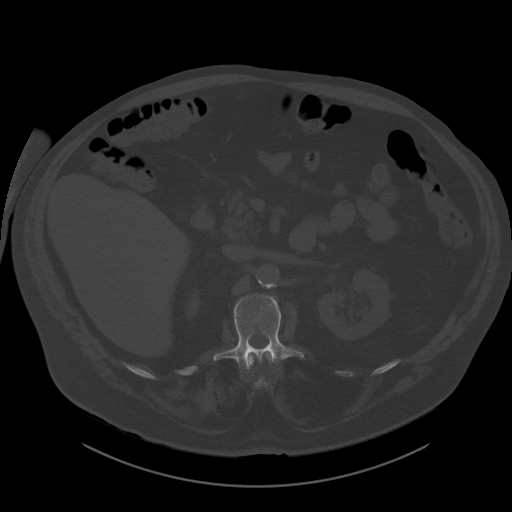
[im 79/101  soft-tissue]
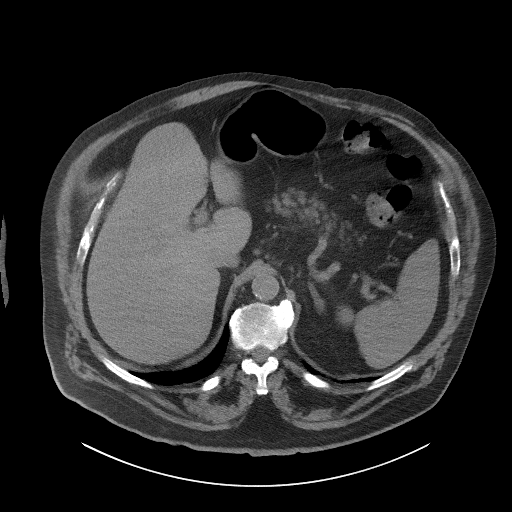
[im 85/101  soft-tissue]
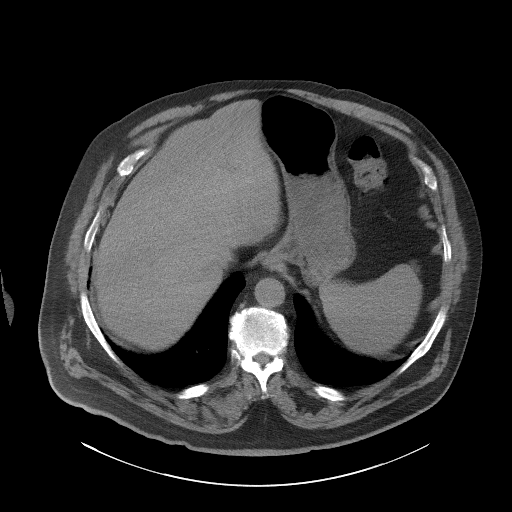
[im 95/101  soft-tissue]
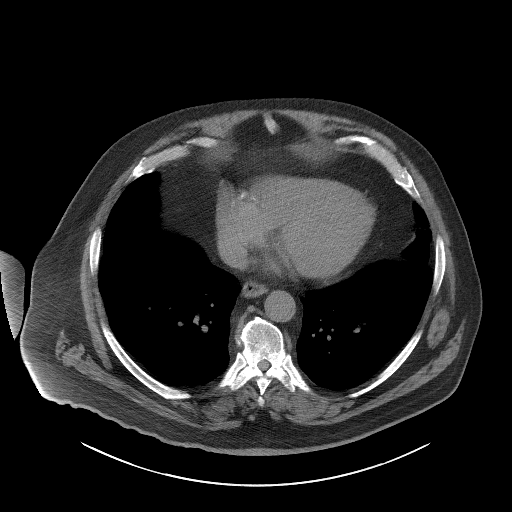

[Series 5: coronal · coronal · 0.98mm/px · 3 of 198 slices shown]
[im 66/198  soft-tissue]
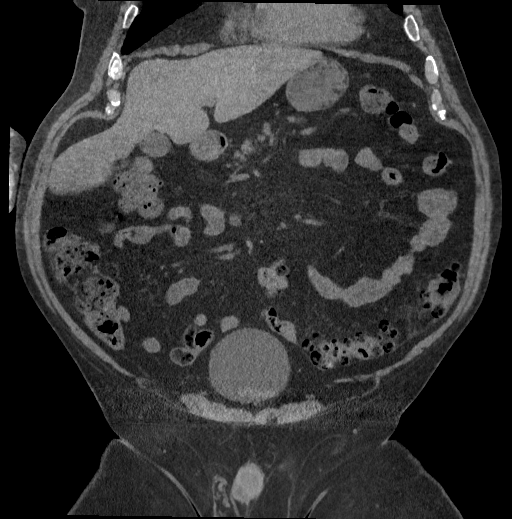
[im 88/198  soft-tissue]
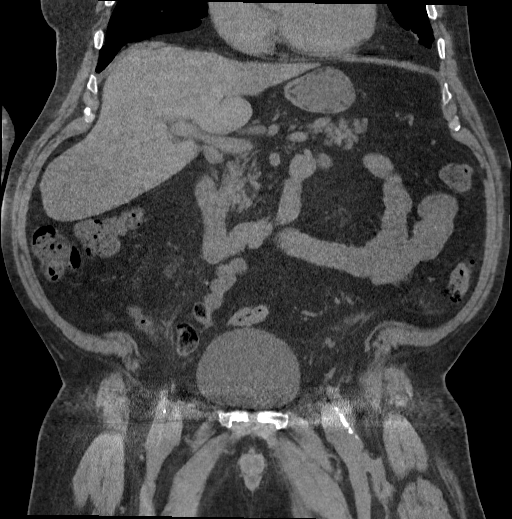
[im 110/198  soft-tissue]
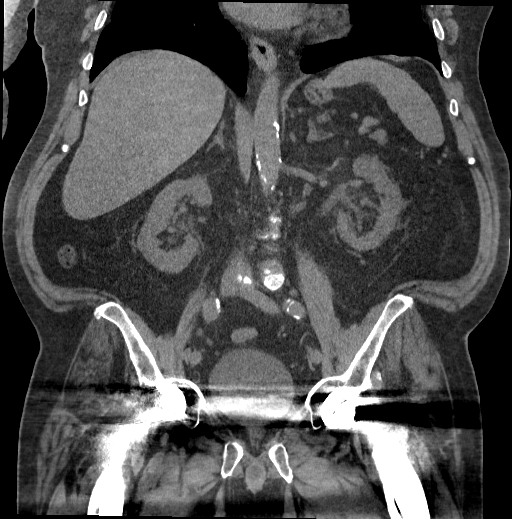

[15 of 46 positions shown; findings below may reference images not displayed]

FINDINGS: Lower chest: Scattered small bilateral lower lobe pulmonary nodules,
insignificantly changed compared to prior imaging and highly likely
benign. The visualized cardiac structures are normal in size.
Unremarkable distal thoracic esophagus.

Hepatobiliary: Normal hepatic contour and morphology. No discrete
hepatic lesions. Normal appearance of the gallbladder. No intra or
extrahepatic biliary ductal dilatation.

Pancreas: Mild fatty atrophy. No discrete mass or inflammatory
change.

Spleen: Punctate calcifications in the lower pole, likely old
granulomatous disease. No acute abnormality.

Adrenals/Urinary Tract: Normal adrenal glands bilaterally.
Nonspecific bilateral perinephric stranding. Probable punctate stone
in the interpolar right kidney. No evidence of hydronephrosis. There
is mild focal dilatation of the right ureter just past the iliac
crest. The distal most ureters and ureterovesicular junctions are
not well seen bilaterally secondary to streak artifact from
bilateral hip prostheses. The left ureter is essentially normal.
Visualization of the bladder is limited by extensive streak artifact
from bilateral hip prostheses. However, there appears to be a
lobular high density mass along the anterior wall of the bladder
which measures approximately 4.2 x 3.5 cm.

Stomach/Bowel: Colonic diverticular disease without CT evidence of
active inflammation. No evidence of bowel wall thickening or bowel
obstruction.

Vascular/Lymphatic: Limited evaluation in the absence of intravenous
contrast. No evidence of aneurysm. Atherosclerotic calcifications
present throughout the abdominal aorta. No suspicious
lymphadenopathy.

Reproductive: Prostate is unremarkable.

Other: No abdominal wall hernia or abnormality. No abdominopelvic
ascites.

Musculoskeletal: Bilateral hip arthroplasties. Advanced lower lumbar
degenerative disc disease and bilateral facet arthropathy. No acute
fracture or aggressive appearing lytic or blastic osseous lesion.
IMPRESSION: 1. Suspect approximately 4.2 x 3.5 cm lobular anterior bladder wall
mass highly concerning for urothelial carcinoma. This is likely
source of the patient's reported hematuria. Recommend urology
consultation.
2. Focal dilation of the right ureter in the pelvis just below the
level of the iliac crest is of uncertain etiology and may reflect
recent passage of a small stone, or perhaps another urothelial
lesion.
3. Punctate nonobstructing stone in the interpolar right kidney. No
evidence of hydronephrosis.
4. Nonspecific perinephric stranding bilaterally.
5. Colonic diverticular disease without CT evidence of active
inflammation.
6.  Aortic Atherosclerosis ([id number]).
7. Lower lumbar degenerative disc disease and bilateral facet
arthropathy.

## 2020-07-19 IMAGING — CR DG CHEST 1V
1 series · 1 of 1 positions shown · non-contrast
Comparison: CT chest 02/16/2013

CLINICAL DATA: Gross hematuria. Difficulty voiding. History of
melanoma, diabetes, hypertension, hip replacements.

EXAM:
CHEST  1 VIEW

[x chest ap]
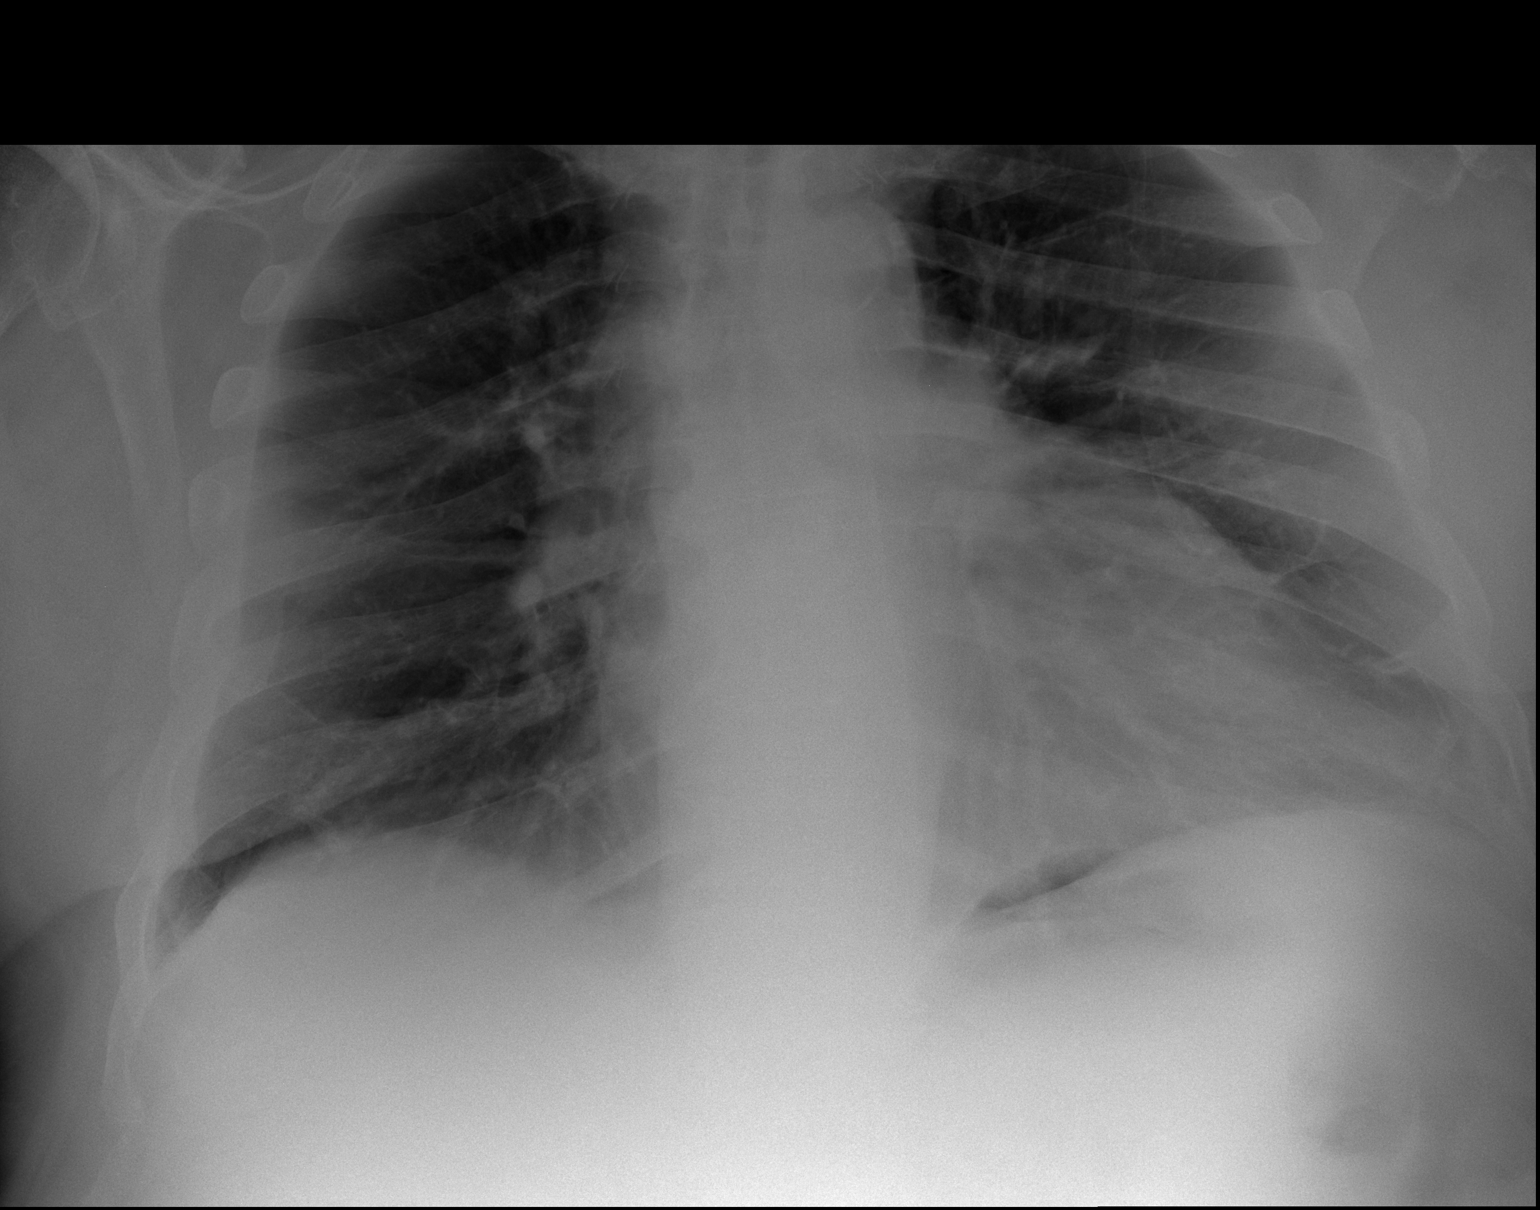

[1 of 1 positions shown; findings below may reference images not displayed]

FINDINGS: Shallow inspiration. Cardiac enlargement. No vascular congestion,
edema, or consolidation. No blunting of costophrenic angles. No
pneumothorax. Mediastinal contours appear intact. Calcification of
the aorta.
IMPRESSION: Cardiac enlargement. No evidence of active pulmonary disease.

## 2021-07-20 ENCOUNTER — Emergency Department: Payer: Medicare Other

## 2021-07-20 ENCOUNTER — Observation Stay
Admission: EM | Admit: 2021-07-20 | Discharge: 2021-07-22 | Disposition: A | Discharge status: Home / Self Care | Payer: Medicare Other | Attending: Internal Medicine | Admitting: Internal Medicine

## 2021-07-20 ENCOUNTER — Observation Stay: Payer: Medicare Other

## 2021-07-20 ENCOUNTER — Other Ambulatory Visit: Payer: Self-pay

## 2021-07-20 DIAGNOSIS — I10 Essential (primary) hypertension: Secondary | ICD-10-CM | POA: Diagnosis present

## 2021-07-20 DIAGNOSIS — Z7982 Long term (current) use of aspirin: Secondary | ICD-10-CM | POA: Diagnosis not present

## 2021-07-20 DIAGNOSIS — Z20822 Contact with and (suspected) exposure to covid-19: Secondary | ICD-10-CM | POA: Diagnosis not present

## 2021-07-20 DIAGNOSIS — E1122 Type 2 diabetes mellitus with diabetic chronic kidney disease: Secondary | ICD-10-CM | POA: Diagnosis not present

## 2021-07-20 DIAGNOSIS — Z7984 Long term (current) use of oral hypoglycemic drugs: Secondary | ICD-10-CM | POA: Insufficient documentation

## 2021-07-20 DIAGNOSIS — R4182 Altered mental status, unspecified: Secondary | ICD-10-CM | POA: Diagnosis not present

## 2021-07-20 DIAGNOSIS — K5732 Diverticulitis of large intestine without perforation or abscess without bleeding: Principal | ICD-10-CM | POA: Insufficient documentation

## 2021-07-20 DIAGNOSIS — Z85820 Personal history of malignant melanoma of skin: Secondary | ICD-10-CM | POA: Diagnosis not present

## 2021-07-20 DIAGNOSIS — K5792 Diverticulitis of intestine, part unspecified, without perforation or abscess without bleeding: Secondary | ICD-10-CM

## 2021-07-20 DIAGNOSIS — Z79899 Other long term (current) drug therapy: Secondary | ICD-10-CM | POA: Insufficient documentation

## 2021-07-20 DIAGNOSIS — E1151 Type 2 diabetes mellitus with diabetic peripheral angiopathy without gangrene: Secondary | ICD-10-CM | POA: Diagnosis present

## 2021-07-20 DIAGNOSIS — K2900 Acute gastritis without bleeding: Secondary | ICD-10-CM | POA: Diagnosis present

## 2021-07-20 DIAGNOSIS — I129 Hypertensive chronic kidney disease with stage 1 through stage 4 chronic kidney disease, or unspecified chronic kidney disease: Secondary | ICD-10-CM | POA: Diagnosis not present

## 2021-07-20 DIAGNOSIS — I2510 Atherosclerotic heart disease of native coronary artery without angina pectoris: Secondary | ICD-10-CM | POA: Diagnosis not present

## 2021-07-20 DIAGNOSIS — N189 Chronic kidney disease, unspecified: Secondary | ICD-10-CM | POA: Insufficient documentation

## 2021-07-20 DIAGNOSIS — Z6841 Body Mass Index (BMI) 40.0 and over, adult: Secondary | ICD-10-CM

## 2021-07-20 DIAGNOSIS — Z87891 Personal history of nicotine dependence: Secondary | ICD-10-CM | POA: Insufficient documentation

## 2021-07-20 DIAGNOSIS — N400 Enlarged prostate without lower urinary tract symptoms: Secondary | ICD-10-CM | POA: Diagnosis present

## 2021-07-20 DIAGNOSIS — R112 Nausea with vomiting, unspecified: Secondary | ICD-10-CM | POA: Diagnosis present

## 2021-07-20 DIAGNOSIS — J45909 Unspecified asthma, uncomplicated: Secondary | ICD-10-CM | POA: Insufficient documentation

## 2021-07-20 LAB — COMPREHENSIVE METABOLIC PANEL
ALT: 23 U/L (ref 0–44)
AST: 42 U/L — ABNORMAL HIGH (ref 15–41)
Albumin: 3.6 g/dL (ref 3.5–5.0)
Alkaline Phosphatase: 48 U/L (ref 38–126)
Anion gap: 11 (ref 5–15)
BUN: 17 mg/dL (ref 8–23)
CO2: 23 mmol/L (ref 22–32)
Calcium: 8.6 mg/dL — ABNORMAL LOW (ref 8.9–10.3)
Chloride: 98 mmol/L (ref 98–111)
Creatinine, Ser: 1.12 mg/dL (ref 0.61–1.24)
GFR, Estimated: >60 mL/min (ref >60)
Glucose, Bld: 181 mg/dL — ABNORMAL HIGH (ref 70–99)
Potassium: 3.8 mmol/L (ref 3.5–5.1)
Sodium: 132 mmol/L — ABNORMAL LOW (ref 135–145)
Total Bilirubin: 2.5 mg/dL — ABNORMAL HIGH (ref 0.3–1.2)
Total Protein: 6.4 g/dL — ABNORMAL LOW (ref 6.5–8.1)

## 2021-07-20 LAB — APTT: aPTT: 33 seconds (ref 24–36)

## 2021-07-20 LAB — CBC WITH DIFFERENTIAL/PLATELET
Abs Immature Granulocytes: 0.03 10*3/uL (ref 0.00–0.07)
Basophils Absolute: 0 10*3/uL (ref 0.0–0.1)
Basophils Relative: 1 %
Eosinophils Absolute: 0 10*3/uL (ref 0.0–0.5)
Eosinophils Relative: 0 %
HCT: 37.3 % — ABNORMAL LOW (ref 39.0–52.0)
Hemoglobin: 12.7 g/dL — ABNORMAL LOW (ref 13.0–17.0)
Immature Granulocytes: 1 %
Lymphocytes Relative: 8 %
Lymphs Abs: 0.3 10*3/uL — ABNORMAL LOW (ref 0.7–4.0)
MCH: 29.3 pg (ref 26.0–34.0)
MCHC: 34 g/dL (ref 30.0–36.0)
MCV: 85.9 fL (ref 80.0–100.0)
Monocytes Absolute: 0.4 10*3/uL (ref 0.1–1.0)
Monocytes Relative: 9 %
Neutro Abs: 3.5 10*3/uL (ref 1.7–7.7)
Neutrophils Relative %: 81 %
Platelets: 117 10*3/uL — ABNORMAL LOW (ref 150–400)
RBC: 4.34 MIL/uL (ref 4.22–5.81)
RDW: 13.5 % (ref 11.5–15.5)
Smear Review: NORMAL
WBC: 4.3 10*3/uL (ref 4.0–10.5)
nRBC: 0 % (ref 0.0–0.2)

## 2021-07-20 LAB — URINALYSIS, COMPLETE (UACMP) WITH MICROSCOPIC
Bacteria, UA: NONE SEEN
Bilirubin Urine: NEGATIVE
Glucose, UA: NEGATIVE mg/dL
Ketones, ur: 15 mg/dL — AB
Leukocytes,Ua: NEGATIVE
Nitrite: NEGATIVE
Protein, ur: >300 mg/dL — AB
Specific Gravity, Urine: 1.02 (ref 1.005–1.030)
pH: 5 (ref 5.0–8.0)

## 2021-07-20 LAB — RESP PANEL BY RT-PCR (FLU A&B, COVID) ARPGX2
Influenza A by PCR: NEGATIVE
Influenza B by PCR: NEGATIVE
SARS Coronavirus 2 by RT PCR: NEGATIVE

## 2021-07-20 LAB — LACTIC ACID, PLASMA
Lactic Acid, Venous: 2.5 mmol/L (ref 0.5–1.9)
Lactic Acid, Venous: 2 mmol/L (ref 0.5–1.9)

## 2021-07-20 LAB — PROTIME-INR
INR: 1.1 (ref 0.8–1.2)
Prothrombin Time: 13.7 seconds (ref 11.4–15.2)

## 2021-07-20 MED ORDER — SODIUM CHLORIDE 0.9% FLUSH: 3.0000 mL | INTRAVENOUS | Status: DC | PRN

## 2021-07-20 MED ORDER — SODIUM CHLORIDE 0.9 % IV SOLN
2.0000 g | Freq: Once | INTRAVENOUS | Status: AC
  Administered 2021-07-20: 2 g via INTRAVENOUS
  Filled 2021-07-20: qty 20

## 2021-07-20 MED ORDER — ACETAMINOPHEN 325 MG PO TABS
ORAL_TABLET | ORAL | Status: AC
  Administered 2021-07-20: 650 mg via ORAL
  Filled 2021-07-20: qty 2

## 2021-07-20 MED ORDER — FUROSEMIDE 20 MG PO TABS
20.0000 mg | ORAL_TABLET | Freq: Every day | ORAL | Status: DC
  Administered 2021-07-21 – 2021-07-22 (×2): 20 mg via ORAL
  Filled 2021-07-20 (×2): qty 1

## 2021-07-20 MED ORDER — TAMSULOSIN HCL 0.4 MG PO CAPS
0.4000 mg | ORAL_CAPSULE | Freq: Every day | ORAL | Status: DC
  Administered 2021-07-21 – 2021-07-22 (×2): 0.4 mg via ORAL
  Filled 2021-07-20 (×2): qty 1

## 2021-07-20 MED ORDER — ACETAMINOPHEN 325 MG PO TABS: 650.0000 mg | ORAL_TABLET | Freq: Once | ORAL | Status: AC

## 2021-07-20 MED ORDER — SODIUM CHLORIDE 0.9% FLUSH
3.0000 mL | Freq: Two times a day (BID) | INTRAVENOUS | Status: DC
  Administered 2021-07-20 – 2021-07-22 (×4): 3 mL via INTRAVENOUS

## 2021-07-20 MED ORDER — INSULIN ASPART 100 UNIT/ML IJ SOLN
0.0000 [IU] | Freq: Three times a day (TID) | INTRAMUSCULAR | Status: DC
  Administered 2021-07-21 (×2): 4 [IU] via SUBCUTANEOUS
  Administered 2021-07-21: 3 [IU] via SUBCUTANEOUS
  Filled 2021-07-20 (×3): qty 1

## 2021-07-20 MED ORDER — ONDANSETRON HCL 4 MG/2ML IJ SOLN
4.0000 mg | Freq: Once | INTRAMUSCULAR | Status: AC
  Administered 2021-07-20: 4 mg via INTRAVENOUS
  Filled 2021-07-20: qty 2

## 2021-07-20 MED ORDER — AMLODIPINE BESYLATE 5 MG PO TABS
5.0000 mg | ORAL_TABLET | Freq: Two times a day (BID) | ORAL | Status: DC
  Administered 2021-07-21 – 2021-07-22 (×3): 5 mg via ORAL
  Filled 2021-07-20 (×4): qty 1

## 2021-07-20 MED ORDER — SODIUM CHLORIDE 0.9 % IV SOLN: 250.0000 mL | INTRAVENOUS | Status: DC | PRN

## 2021-07-20 MED ORDER — ENOXAPARIN SODIUM 80 MG/0.8ML IJ SOSY
0.5000 mg/kg | PREFILLED_SYRINGE | INTRAMUSCULAR | Status: DC
  Administered 2021-07-21 – 2021-07-22 (×2): 67.5 mg via SUBCUTANEOUS
  Filled 2021-07-20 (×2): qty 0.68

## 2021-07-20 MED ORDER — LOSARTAN POTASSIUM 50 MG PO TABS
100.0000 mg | ORAL_TABLET | Freq: Every day | ORAL | Status: DC
  Administered 2021-07-21 – 2021-07-22 (×2): 100 mg via ORAL
  Filled 2021-07-20 (×2): qty 2

## 2021-07-20 MED ORDER — KETOROLAC TROMETHAMINE 30 MG/ML IJ SOLN: 30.0000 mg | Freq: Once | INTRAMUSCULAR | Status: DC

## 2021-07-20 MED ORDER — ASPIRIN EC 81 MG PO TBEC
81.0000 mg | DELAYED_RELEASE_TABLET | Freq: Every day | ORAL | Status: DC
  Administered 2021-07-21 – 2021-07-22 (×2): 81 mg via ORAL
  Filled 2021-07-20 (×2): qty 1

## 2021-07-20 MED ORDER — METOCLOPRAMIDE HCL 5 MG/ML IJ SOLN
10.0000 mg | Freq: Two times a day (BID) | INTRAMUSCULAR | Status: DC
  Administered 2021-07-21: 10 mg via INTRAVENOUS
  Filled 2021-07-20: qty 2

## 2021-07-20 MED ORDER — METRONIDAZOLE 500 MG/100ML IV SOLN
500.0000 mg | Freq: Once | INTRAVENOUS | Status: AC
Start: 2021-07-20 — End: 2021-07-20
  Administered 2021-07-20: 500 mg via INTRAVENOUS
  Filled 2021-07-20: qty 100

## 2021-07-20 MED ORDER — LACTATED RINGERS IV BOLUS (SEPSIS)
1000.0000 mL | Freq: Once | INTRAVENOUS | Status: AC
  Administered 2021-07-20: 1000 mL via INTRAVENOUS

## 2021-07-20 MED ORDER — ATORVASTATIN CALCIUM 10 MG PO TABS
10.0000 mg | ORAL_TABLET | Freq: Every day | ORAL | Status: DC
Start: 2021-07-20 — End: 2021-07-22
  Administered 2021-07-21 (×2): 10 mg via ORAL
  Filled 2021-07-20 (×2): qty 1

## 2021-07-20 MED ORDER — METOPROLOL SUCCINATE ER 50 MG PO TB24
50.0000 mg | ORAL_TABLET | Freq: Every day | ORAL | Status: DC
  Administered 2021-07-21 – 2021-07-22 (×2): 50 mg via ORAL
  Filled 2021-07-20 (×2): qty 1

## 2021-07-20 MED ORDER — PANTOPRAZOLE SODIUM 40 MG IV SOLR: 40.0000 mg | INTRAVENOUS | Status: DC

## 2021-07-20 MED ORDER — VITAMIN B-12 1000 MCG PO TABS
1000.0000 ug | ORAL_TABLET | Freq: Every day | ORAL | Status: DC
  Administered 2021-07-21 – 2021-07-22 (×2): 1000 ug via ORAL
  Filled 2021-07-20 (×3): qty 1

## 2021-07-20 MED ORDER — ACETAMINOPHEN 500 MG PO TABS
500.0000 mg | ORAL_TABLET | Freq: Three times a day (TID) | ORAL | Status: DC | PRN
  Administered 2021-07-21 – 2021-07-22 (×4): 500 mg via ORAL
  Filled 2021-07-20 (×4): qty 1

## 2021-07-20 MED ORDER — GABAPENTIN 100 MG PO CAPS
200.0000 mg | ORAL_CAPSULE | Freq: Every evening | ORAL | Status: DC
  Administered 2021-07-21 (×2): 200 mg via ORAL
  Filled 2021-07-20 (×2): qty 2

## 2021-07-20 MED ORDER — MONTELUKAST SODIUM 10 MG PO TABS
10.0000 mg | ORAL_TABLET | Freq: Every day | ORAL | Status: DC
  Administered 2021-07-21 – 2021-07-22 (×2): 10 mg via ORAL
  Filled 2021-07-20 (×2): qty 1

## 2021-07-20 MED ORDER — IOHEXOL 300 MG/ML  SOLN
100.0000 mL | Freq: Once | INTRAMUSCULAR | Status: AC | PRN
  Administered 2021-07-20: 100 mL via INTRAVENOUS

## 2021-07-20 NOTE — ED Provider Notes (Signed)
[facility] Provider Note    Event Date/Time   First MD Initiated Contact with Patient 07/20/21 1853     (approximate)   History   Emesis   HPI Keith Cain is a 88 y.o. male with a past medical history of CAD, chronic gout, type 2 diabetes, and recurrent right hip dislocations who presents via EMS with reports of fever, nausea/vomiting, and altered mental status per family.  States all symptoms just started today and have been worsening since onset.  Patient states last p.o. intake was last night and he has been unable to keep anything down since then.  Patient denies any abdominal pain.  Patient only describes nausea     Physical Exam   Triage Vital Signs: ED Triage Vitals  Enc Vitals Group     BP --      Pulse --      Resp --      Temp --      Temp src --      SpO2 --      Weight 07/20/21 1851 300 lb (136.1 kg)     Height 07/20/21 1851 5\' 8"  (1.727 m)     Head Circumference --      Peak Flow --      Pain Score 07/20/21 1850 0     Pain Loc --      Pain Edu? --      Excl. in [place]? --     Most recent vital signs: Vitals:   07/20/21 2230 07/20/21 2300  BP: (!) 152/56 138/65  Pulse: 63 96  Resp: 16 16  Temp:    SpO2: 96% 96%    General: Awake, oriented x3 CV:  Good peripheral perfusion.  Resp:  Normal effort.  Mild Rales over left lung field Abd:  No distention.  No tenderness to palpation Other:  Morbidly obese elderly Caucasian male laying in bed in no acute distress   ED Results / Procedures / Treatments   Labs (all labs ordered are listed, but only abnormal results are displayed) Labs Reviewed  LACTIC ACID, PLASMA - Abnormal; Notable for the following components:      Result Value   Lactic Acid, Venous 2.5 (*)    All other components within normal limits  LACTIC ACID, PLASMA - Abnormal; Notable for the following components:   Lactic Acid, Venous 2.0 (*)    All other components within normal limits  COMPREHENSIVE METABOLIC  PANEL - Abnormal; Notable for the following components:   Sodium 132 (*)    Glucose, Bld 181 (*)    Calcium 8.6 (*)    Total Protein 6.4 (*)    AST 42 (*)    Total Bilirubin 2.5 (*)    All other components within normal limits  CBC WITH DIFFERENTIAL/PLATELET - Abnormal; Notable for the following components:   Hemoglobin 12.7 (*)    HCT 37.3 (*)    Platelets 117 (*)    Lymphs Abs 0.3 (*)    All other components within normal limits  URINALYSIS, COMPLETE (UACMP) WITH MICROSCOPIC - Abnormal; Notable for the following components:   APPearance CLEAR (*)    Hgb urine dipstick SMALL (*)    Ketones, ur 15 (*)    Protein, ur >300 (*)    All other components within normal limits  RESP PANEL BY RT-PCR (FLU A&B, COVID) ARPGX2  CULTURE, BLOOD (ROUTINE X 2)  CULTURE, BLOOD (ROUTINE X 2)  URINE CULTURE  PROTIME-INR  APTT  BASIC METABOLIC PANEL  CBC  HEMOGLOBIN A1C  LACTIC ACID, PLASMA  LACTIC ACID, PLASMA     EKG ED ECG REPORT I, Naaman Plummer, the attending physician, personally viewed and interpreted this ECG.  Date: 07/20/2021 EKG Time: 1942 Rate: 67 Rhythm: normal sinus rhythm QRS Axis: normal Intervals: normal ST/T Wave abnormalities: normal Narrative Interpretation: no evidence of acute ischemia   RADIOLOGY  ED MD interpretation: CT of his abdomen and pelvis with IV contrast shows mild pericolic stranding in the junction of the sigmoid and descending colon significant for possible previous diverticulitis versus mild acute diverticulitis as well as thickening in the ascending colon concerning for colitis as well.  Official radiology report(s): CT Abdomen Pelvis W Contrast  Result Date: 07/20/2021 CLINICAL DATA:  Abdominal pain EXAM: CT ABDOMEN AND PELVIS WITH CONTRAST TECHNIQUE: Multidetector CT imaging of the abdomen and pelvis was performed using the standard protocol following bolus administration of intravenous contrast. RADIATION DOSE REDUCTION: This exam was  performed according to the departmental dose-optimization program which includes automated exposure control, adjustment of the mA and/or kV according to patient size and/or use of iterative reconstruction technique. CONTRAST:  133mL OMNIPAQUE IOHEXOL 300 MG/ML  SOLN COMPARISON:  07/08/2018 FINDINGS: Lower chest: Heart is enlarged in size. Hepatobiliary: Liver measures 18.9 cm. There is fatty infiltration. Gallbladder is unremarkable. Pancreas: No focal abnormality is seen. There is fatty infiltration. Spleen: Spleen is enlarged measuring 14.6 cm in maximum diameter. There are few calcified granulomas in the spleen. Adrenals/Urinary Tract: Adrenals are unremarkable. There is no hydronephrosis. There are no demonstrable renal or ureteral stones. Urinary bladder is partly obscured by severe beam hardening artifacts caused by orthopedic hardware in both hips limiting evaluation. Stomach/Bowel: Stomach is unremarkable. Small bowel loops are not dilated. Appendix is not distinctly seen. There is no pericecal inflammation. There is mild wall thickening in the ascending colon. Multiple diverticula are seen in the colon. There is minimal stranding in the fat planes adjacent to the colon in the left lower quadrant. There is no loculated pericolic fluid collection. Vascular/Lymphatic: Atherosclerotic plaques and calcifications are seen in the aorta and its major branches. Reproductive: Evaluation is limited by beam hardening artifacts. Other: There is no ascites or pneumoperitoneum. Bilateral inguinal hernias containing fat are seen, larger on the left side. Small umbilical hernia containing fat is seen. Musculoskeletal: Last lumbar vertebra is transitional. This report assumes partial sacralization of L5 vertebra. There is slight decrease in height of 1 of the lower lumbar vertebral bodies, possibly L3. Schmorl's nodes are seen in the upper and lower endplates, more so in the upper endplate. There is mild sclerosis in the  right side of body of L3 vertebra. Degenerative changes are noted with encroachment of neural foramina and spinal stenosis at multiple levels in the lumbar spine, more so at L3-L4 level. IMPRESSION: There is no evidence intestinal obstruction or pneumoperitoneum. There is no hydronephrosis. Diverticulosis of colon. There is minimal pericolic stranding at the junction of sigmoid and descending colon without significant focal wall thickening in colon. This finding may suggest residual changes from previous diverticulitis. Less likely possibility would be mild acute diverticulitis. There is no loculated pericolic fluid collection. There is mild wall thickening in the ascending colon which may be due to incomplete distention or suggest nonspecific colitis. Enlarged fatty liver.  Enlarged spleen. Lumbar spondylosis with spinal stenosis and encroachment of neural foramina at multiple levels. There is interval mild decrease in height of body of L3 vertebra and interval appearance of  Schmorl's nodes and adjacent sclerosis in the body of L3 vertebra. Possibility of active inflammation in the Schmorl's nodes is not excluded. Electronically Signed   By: Elmer Picker M.D.   On: 07/20/2021 20:45   DG Chest Port 1 View  Result Date: 07/20/2021 CLINICAL DATA:  Questionable sepsis EXAM: PORTABLE CHEST 1 VIEW COMPARISON:  07/08/2018 FINDINGS: Cardiomegaly, vascular congestion. No confluent opacities, effusions or overt edema. No acute bony abnormality. IMPRESSION: Cardiomegaly, vascular congestion. Electronically Signed   By: Rolm Baptise M.D.   On: 07/20/2021 19:27   US Abdomen Limited RUQ (LIVER/GB)  Result Date: 07/20/2021 CLINICAL DATA:  Nausea, vomiting EXAM: ULTRASOUND ABDOMEN LIMITED RIGHT UPPER QUADRANT COMPARISON:  CT earlier today FINDINGS: Gallbladder: No gallstones or wall thickening visualized. No sonographic Murphy sign noted by sonographer. Common bile duct: Diameter: Normal caliber, 4 mm Liver:  Increased echotexture compatible with fatty infiltration. No focal abnormality or biliary ductal dilatation. Portal vein is patent on color Doppler imaging with normal direction of blood flow towards the liver. Other: None. IMPRESSION: Hepatic steatosis. No acute findings. Electronically Signed   By: Rolm Baptise M.D.   On: 07/20/2021 23:23      PROCEDURES:  Critical Care performed: No  .1-3 Lead EKG Interpretation Performed by: Naaman Plummer, MD Authorized by: Naaman Plummer, MD     Interpretation: normal     ECG rate:  94   ECG rate assessment: normal     Rhythm: sinus rhythm     Ectopy: none     Conduction: normal     MEDICATIONS ORDERED IN ED: Medications  ketorolac (TORADOL) 30 MG/ML injection 30 mg (30 mg Intravenous Patient Refused/Not Given 07/20/21 2001)  acetaminophen (TYLENOL) tablet 500 mg (has no administration in time range)  aspirin EC tablet 81 mg (has no administration in time range)  amLODipine (NORVASC) tablet 5 mg (has no administration in time range)  atorvastatin (LIPITOR) tablet 10 mg (has no administration in time range)  furosemide (LASIX) tablet 20 mg (has no administration in time range)  losartan (COZAAR) tablet 100 mg (has no administration in time range)  metoprolol succinate (TOPROL-XL) 24 hr tablet 50 mg (has no administration in time range)  tamsulosin (FLOMAX) capsule 0.4 mg (has no administration in time range)  vitamin B-12 (CYANOCOBALAMIN) tablet 1,000 mcg (has no administration in time range)  gabapentin (NEURONTIN) capsule 200 mg (has no administration in time range)  montelukast (SINGULAIR) tablet 10 mg (has no administration in time range)  enoxaparin (LOVENOX) injection 67.5 mg (has no administration in time range)  insulin aspart (novoLOG) injection 0-20 Units (has no administration in time range)  sodium chloride flush (NS) 0.9 % injection 3 mL (3 mLs Intravenous Given 07/20/21 2329)  sodium chloride flush (NS) 0.9 % injection 3 mL  (has no administration in time range)  0.9 %  sodium chloride infusion (has no administration in time range)  metoCLOPramide (REGLAN) injection 10 mg (has no administration in time range)  pantoprazole (PROTONIX) injection 40 mg (has no administration in time range)  lactated ringers bolus 1,000 mL (0 mLs Intravenous Stopped 07/20/21 2127)  cefTRIAXone (ROCEPHIN) 2 g in sodium chloride 0.9 % 100 mL IVPB (0 g Intravenous Stopped 07/20/21 2013)  metroNIDAZOLE (FLAGYL) IVPB 500 mg (0 mg Intravenous Stopped 07/20/21 2126)  ondansetron (ZOFRAN) injection 4 mg (4 mg Intravenous Given 07/20/21 1958)  acetaminophen (TYLENOL) tablet 650 mg (650 mg Oral Given 07/20/21 1959)  iohexol (OMNIPAQUE) 300 MG/ML solution 100 mL (100 mLs Intravenous Contrast  Given 07/20/21 2015)     IMPRESSION / MDM / ASSESSMENT AND PLAN / ED COURSE  I reviewed the triage vital signs and the nursing notes.                              Differential diagnosis includes, but is not limited to, diverticulitis, small bowel obstruction, colitis, appendicitis, cholecystitis, ascending cholangitis  The patient is on the cardiac monitor to evaluate for evidence of arrhythmia and/or significant heart rate changes.  Patient is an 88 year old male that presents for nausea and vomiting via EMS.  Soon after arrival patient did complain of some generalized abdominal pain.  Patient's laboratory evaluation significant for lactic acidosis of 2.5 that improved to 2.0 after fluid administration.  CBC did not show any evidence of acute abnormalities.  CMP did show mild hyponatremia to 132, elevated glucose to 181, decreased calcium to 8.6, decreased protein of 6.4 and mildly elevated AST to 42 with an elevated T bili to 2.5. Given evidence of colitis on CT with emesis as well as altered mental status, patient will require admission to the internal medicine service for further evaluation and management Consults: Hospitalist-agrees to accept this patient  onto their service for further evaluation and management  Dispo: Admit to medicine      FINAL CLINICAL IMPRESSION(S) / ED DIAGNOSES   Final diagnoses:  Nausea and vomiting, unspecified vomiting type  Altered mental status, unspecified altered mental status type     Rx / DC Orders   ED Discharge Orders     None        Note:  This document was prepared using Dragon voice recognition software and may include unintentional dictation errors.   Naaman Plummer, MD 07/20/21 2340

## 2021-07-20 NOTE — ED Triage Notes (Signed)
[facility] reports pt coming from home for fever, n/v that started today. EMS temp 101.2. EMS gave fluid and zofran.

## 2021-07-20 NOTE — H&P (Signed)
History and Physical    Keith Cain WNU:[id number] DOB: 1932/03/21 DOA: 07/20/2021  PCP: Rusty Aus, MD   Patient coming from: Home  I have personally briefly reviewed patient's old medical records in [place]  Chief Complaint: Fever Most of the history was obtained from patient's daughter at the bedside.  HPI: Keith Cain is a 88 y.o. male with medical history significant for morbid obesity, diabetes mellitus, hypertension, BPH, history of diverticular disease who was brought into the ER by EMS for evaluation of fever and mental status changes. Per his daughter he has had intermittent nausea for about 4 days and on the day of admission he developed emesis which contained food but no blood.  She also states that he did have an episode of diffuse abdominal pain which seems to have resolved at the time of this history and physical.  He had a T-max of 101.2 by EMS and received a dose of Zofran and Tylenol.  He denies having any diarrhea.   He complains of dysuria and frequency but denies having any hematuria. He denies having any cough, no chest pain, no shortness of breath, no headache, no dizziness, no lightheadedness, no leg swelling, no focal deficit or blurred vision. His daughter states that he was confused prior to her calling EMS but during my evaluation he is awake, alert and oriented to person place and time. Sodium 132, potassium 3.8, chloride 98, bicarb 23, glucose 181, BUN 17, creatinine 1.12, calcium 8.6, anion gap 11, alkaline phosphatase 48, albumin 3.6, AST 42, ALT 23, total protein 6.4, total bilirubin 2.5 lactic acid 2.5 >> 2.0, white count 4.3, hemoglobin 12.7, hematocrit 37.3, MCV 85.9, RDW 13.5, platelet count 117, PT 13.7, INR 1.1 Respiratory viral panel is negative Urine analysis is sterile CT scan of abdomen and pelvis shows no evidence of intestinal obstruction or pneumoperitoneum.  There is no hydronephrosis.  Diverticulosis of colon.  Mild wall  thickening in the ascending colon which may be due to incomplete distention or suggest nonspecific colitis. Chest x-ray reviewed by me shows cardiomegaly with vascular congestion. Twelve-lead EKG reviewed by me shows sinus rhythm with low voltage QRS.  Right axis deviation    ED Course: Patient is an 88 year old male who presents to the ER for evaluation of a 4-day history of intermittent nausea which has worsened and is now associated with emesis and fever. He did have lactic acidosis which shows some improvement. He will be referred to observation status for further evaluation.   Review of Systems: As per HPI otherwise all other systems reviewed and negative.    Past Medical History:  Diagnosis Date   Acute renal failure (HCC)    Anemia    Arthritis    RA   Asthma    Cancer ([place])    melanoma   Chronic kidney disease    Complication of anesthesia    HARD TO WAKE UP PATIENT STATES PCP SAID NO IV SEDATION/ HIGH RISK SINCE HARD TO WAKE UP   Diabetes mellitus without complication (HCC)    Edema    Hypertension    Lung nodules    Neuropathy    Oxygen deficiency    WEARS HS   PONV (postoperative nausea and vomiting)    Prostate disorder    Sleep apnea    CPAP    Past Surgical History:  Procedure Laterality Date   APPENDECTOMY     BACK SURGERY     lumbar   CATARACT EXTRACTION W/PHACO  Right 08/30/2016   Procedure: CATARACT EXTRACTION PHACO AND INTRAOCULAR LENS PLACEMENT (IOC);  Surgeon: Eulogio Bear, MD;  Location: [facility] ORS;  Service: Ophthalmology;  Laterality: Right;  Lot# [id number] H [place]: 00:55.5 AP%: 11.0 CDE: 6.08   CATARACT EXTRACTION W/PHACO Left 09/27/2016   Procedure: CATARACT EXTRACTION PHACO AND INTRAOCULAR LENS PLACEMENT (IOC);  Surgeon: Eulogio Bear, MD;  Location: [facility] ORS;  Service: Ophthalmology;  Laterality: Left;  [place] 54.9 AP% 13.4 CDE 7.36 Fluid pack lot # [id number]   CYSTOSCOPY WITH BIOPSY N/A 10/14/2018   Procedure: CYSTOSCOPY WITH BLADDER BIOPSY;   Surgeon: Abbie Sons, MD;  Location: [facility] ORS;  Service: Urology;  Laterality: N/A;   FOREARM SURGERY     RIGHT   JOINT REPLACEMENT Bilateral    TH X 3   RCR Right    rotator cuff repair   TRANSURETHRAL RESECTION OF BLADDER TUMOR N/A 08/20/2018   Procedure: TRANSURETHRAL RESECTION OF BLADDER TUMOR (TURBT);  Surgeon: Abbie Sons, MD;  Location: [facility] OR[facility];  Service: Urology;  Laterality: N/A;   TRANSURETHRAL RESECTION OF BLADDER TUMOR N/A 10/14/2018   Procedure: TRANSURETHRAL RESECTION OF BLADDER TUMOR (TURBT);  Surgeon: Abbie Sons, MD;  Location: [facility] OR[facility];  Service: Urology;  Laterality: N/A;     reports that he quit smoking about 63 years ago. He has quit using smokeless tobacco. He reports that he does not drink alcohol and does not use drugs.  Allergies  Allergen Reactions   Codeine Shortness Of Breath and Rash   Dilaudid [Hydromorphone Hcl] Other (See Comments)    Stopped breathing   Morphine And Related Shortness Of Breath and Rash   Percocet [Oxycodone-Acetaminophen] Shortness Of Breath and Rash   Versed [Midazolam] Anaphylaxis   Vicodin [Hydrocodone-Acetaminophen] Shortness Of Breath and Rash   Ambien [Zolpidem Tartrate]     Hallucinations     History reviewed. No pertinent family history.  Parents are deceased   Prior to Admission medications   Medication Sig Start Date End Date Taking? Authorizing Provider  amLODipine (NORVASC) 5 MG tablet Take 5 mg by mouth 2 (two) times daily.   Yes [provider]  aspirin EC 81 MG tablet Take 81 mg by mouth daily.   Yes [provider]  atorvastatin (LIPITOR) 10 MG tablet Take 10 mg by mouth at bedtime.    Yes [provider]  furosemide (LASIX) 20 MG tablet Take 20 mg by mouth daily.    Yes [provider]  gabapentin (NEURONTIN) 100 MG capsule Take 200 mg by mouth every evening. 03/13/21  Yes [provider]  glimepiride (AMARYL) 1 MG tablet Take 1 mg by mouth daily.    Yes  [provider]  losartan (COZAAR) 100 MG tablet Take 100 mg by mouth daily.   Yes [provider]  metFORMIN (GLUCOPHAGE) 500 MG tablet Take 1,000 mg by mouth 2 (two) times daily with a meal.   Yes [provider]  metoprolol succinate (TOPROL-XL) 50 MG 24 hr tablet Take 50 mg by mouth daily.    Yes [provider]  montelukast (SINGULAIR) 10 MG tablet Take 10 mg by mouth daily. 07/14/21  Yes [provider]  tamsulosin (FLOMAX) 0.4 MG CAPS capsule Take 0.4 mg by mouth daily.    Yes [provider]  vitamin B-12 (CYANOCOBALAMIN) 1000 MCG tablet Take 1,000 mcg by mouth daily.   Yes [provider]  acetaminophen (TYLENOL) 500 MG tablet Take 500 mg by mouth every 8 (eight) hours as needed  for moderate pain or headache.     [provider]  colchicine 0.6 MG tablet Take 0.6 mg by mouth daily as needed (gout).  Patient not taking: Reported on 07/20/2021    [provider]    Physical Exam: Vitals:   07/20/21 2100 07/20/21 2130 07/20/21 2200 07/20/21 2224  BP: (!) 153/60 (!) 151/52 (!) 128/48   Pulse: 62 66 63   Resp: 19 14 20    Temp:    99.8 F (37.7 C)  TempSrc:    Oral  SpO2: 96% 97% 97%   Weight:      Height:         Vitals:   07/20/21 2100 07/20/21 2130 07/20/21 2200 07/20/21 2224  BP: (!) 153/60 (!) 151/52 (!) 128/48   Pulse: 62 66 63   Resp: 19 14 20    Temp:    99.8 F (37.7 C)  TempSrc:    Oral  SpO2: 96% 97% 97%   Weight:      Height:          Constitutional: Alert and oriented x 3 . Not in any apparent distress.  Morbidly obese HEENT:      Head: Normocephalic and atraumatic.         Eyes: PERLA, EOMI, Conjunctivae are normal. Sclera is non-icteric.       Mouth/Throat: Mucous membranes are moist.       Neck: Supple with no signs of meningismus. Cardiovascular: Regular rate and rhythm. No murmurs, gallops, or rubs. 2+ symmetrical distal pulses are present . No JVD. No LE  edema Respiratory: Respiratory effort normal .Lungs sounds clear bilaterally. No wheezes, crackles, or rhonchi.  Gastrointestinal: Soft, non tender, and non distended with positive bowel sounds.  Genitourinary: No CVA tenderness. Musculoskeletal: Nontender with normal range of motion in all extremities. No cyanosis, or erythema of extremities. Neurologic:  Face is symmetric. Moving all extremities. No gross focal neurologic deficits . Skin: Skin is warm, dry.  No rash or ulcers Psychiatric: Mood and affect are normal    Labs on Admission: I have personally reviewed following labs and imaging studies  CBC: Recent Labs  Lab 07/20/21 1905  WBC 4.3  NEUTROABS 3.5  HGB 12.7*  HCT 37.3*  MCV 85.9  PLT 626*   Basic Metabolic Panel: Recent Labs  Lab 07/20/21 1905  NA 132*  K 3.8  CL 98  CO2 23  GLUCOSE 181*  BUN 17  CREATININE 1.12  CALCIUM 8.6*   GFR: Estimated Creatinine Clearance: 62.8 mL/min (by C-G formula based on SCr of 1.12 mg/dL). Liver Function Tests: Recent Labs  Lab 07/20/21 1905  AST 42*  ALT 23  ALKPHOS 48  BILITOT 2.5*  PROT 6.4*  ALBUMIN 3.6   No results for input(s): LIPASE, AMYLASE in the last 168 hours. No results for input(s): AMMONIA in the last 168 hours. Coagulation Profile: Recent Labs  Lab 07/20/21 1905  INR 1.1   Cardiac Enzymes: No results for input(s): CKTOTAL, CKMB, CKMBINDEX, TROPONINI in the last 168 hours. BNP (last 3 results) No results for input(s): PROBNP in the last 8760 hours. HbA1C: No results for input(s): HGBA1C in the last 72 hours. CBG: No results for input(s): GLUCAP in the last 168 hours. Lipid Profile: No results for input(s): CHOL, HDL, LDLCALC, TRIG, CHOLHDL, LDLDIRECT in the last 72 hours. Thyroid Function Tests: No results for input(s): TSH, T4TOTAL, FREET4, T3FREE, THYROIDAB in the last 72 hours. Anemia Panel: No results for input(s): VITAMINB12, FOLATE, FERRITIN, TIBC, IRON,  RETICCTPCT in the last 72  hours. Urine analysis:    Component Value Date/Time   COLORURINE YELLOW 07/20/2021 2055   APPEARANCEUR CLEAR (A) 07/20/2021 2055   APPEARANCEUR Clear 12/15/2018 1437   LABSPEC 1.020 07/20/2021 2055   PHURINE 5.0 07/20/2021 2055   GLUCOSEU NEGATIVE 07/20/2021 2055   HGBUR SMALL (A) 07/20/2021 2055   BILIRUBINUR NEGATIVE 07/20/2021 2055   BILIRUBINUR Negative 12/15/2018 1437   KETONESUR 15 (A) 07/20/2021 2055   PROTEINUR >300 (A) 07/20/2021 2055   NITRITE NEGATIVE 07/20/2021 2055   LEUKOCYTESUR NEGATIVE 07/20/2021 2055    Radiological Exams on Admission: CT Abdomen Pelvis W Contrast  Result Date: 07/20/2021 CLINICAL DATA:  Abdominal pain EXAM: CT ABDOMEN AND PELVIS WITH CONTRAST TECHNIQUE: Multidetector CT imaging of the abdomen and pelvis was performed using the standard protocol following bolus administration of intravenous contrast. RADIATION DOSE REDUCTION: This exam was performed according to the departmental dose-optimization program which includes automated exposure control, adjustment of the mA and/or kV according to patient size and/or use of iterative reconstruction technique. CONTRAST:  143mL OMNIPAQUE IOHEXOL 300 MG/ML  SOLN COMPARISON:  07/08/2018 FINDINGS: Lower chest: Heart is enlarged in size. Hepatobiliary: Liver measures 18.9 cm. There is fatty infiltration. Gallbladder is unremarkable. Pancreas: No focal abnormality is seen. There is fatty infiltration. Spleen: Spleen is enlarged measuring 14.6 cm in maximum diameter. There are few calcified granulomas in the spleen. Adrenals/Urinary Tract: Adrenals are unremarkable. There is no hydronephrosis. There are no demonstrable renal or ureteral stones. Urinary bladder is partly obscured by severe beam hardening artifacts caused by orthopedic hardware in both hips limiting evaluation. Stomach/Bowel: Stomach is unremarkable. Small bowel loops are not dilated. Appendix is not distinctly seen. There is no pericecal inflammation. There is  mild wall thickening in the ascending colon. Multiple diverticula are seen in the colon. There is minimal stranding in the fat planes adjacent to the colon in the left lower quadrant. There is no loculated pericolic fluid collection. Vascular/Lymphatic: Atherosclerotic plaques and calcifications are seen in the aorta and its major branches. Reproductive: Evaluation is limited by beam hardening artifacts. Other: There is no ascites or pneumoperitoneum. Bilateral inguinal hernias containing fat are seen, larger on the left side. Small umbilical hernia containing fat is seen. Musculoskeletal: Last lumbar vertebra is transitional. This report assumes partial sacralization of L5 vertebra. There is slight decrease in height of 1 of the lower lumbar vertebral bodies, possibly L3. Schmorl's nodes are seen in the upper and lower endplates, more so in the upper endplate. There is mild sclerosis in the right side of body of L3 vertebra. Degenerative changes are noted with encroachment of neural foramina and spinal stenosis at multiple levels in the lumbar spine, more so at L3-L4 level. IMPRESSION: There is no evidence intestinal obstruction or pneumoperitoneum. There is no hydronephrosis. Diverticulosis of colon. There is minimal pericolic stranding at the junction of sigmoid and descending colon without significant focal wall thickening in colon. This finding may suggest residual changes from previous diverticulitis. Less likely possibility would be mild acute diverticulitis. There is no loculated pericolic fluid collection. There is mild wall thickening in the ascending colon which may be due to incomplete distention or suggest nonspecific colitis. Enlarged fatty liver.  Enlarged spleen. Lumbar spondylosis with spinal stenosis and encroachment of neural foramina at multiple levels. There is interval mild decrease in height of body of L3 vertebra and interval appearance of Schmorl's nodes and adjacent sclerosis in the body of  L3 vertebra. Possibility of active inflammation in  the Schmorl's nodes is not excluded. Electronically Signed   By: Elmer Picker M.D.   On: 07/20/2021 20:45   DG Chest Port 1 View  Result Date: 07/20/2021 CLINICAL DATA:  Questionable sepsis EXAM: PORTABLE CHEST 1 VIEW COMPARISON:  07/08/2018 FINDINGS: Cardiomegaly, vascular congestion. No confluent opacities, effusions or overt edema. No acute bony abnormality. IMPRESSION: Cardiomegaly, vascular congestion. Electronically Signed   By: Rolm Baptise M.D.   On: 07/20/2021 19:27     Assessment/Plan Principal Problem:   Acute gastritis Active Problems:   Benign essential hypertension   BPH (benign prostatic hyperplasia)   CAD (coronary artery disease), native coronary artery   Morbid obesity with BMI of 45.0-49.9, adult (HCC)   Type 2 diabetes mellitus with peripheral angiopathy [facility])      Patient is an 88 year old male admitted to the hospital for evaluation of fever associated with nausea and emesis.   Acute gastritis rule out acute cholecystitis In a patient who presents for evaluation of intermittent nausea associated with emesis and fever (T max 101). Patient's COVID-19 PCR test is negative Will obtain right upper quadrant ultrasound to rule out acute cholecystitis Supportive care with antiemetics, IV PPI Patient received a dose of IV Rocephin and Flagyl in the ER. Will hold on administering further antibiotic therapy until imaging results become available. Place patient on a full liquid diet and advance as tolerated    Hypertension Continue amlodipine, metoprolol and losartan    Diabetes mellitus Place patient on full liquid diet and advance as tolerated Hold metformin and glipizide Glycemic control sliding scale insulin    Coronary artery disease Continue aspirin, statins and metoprolol    Morbid obesity (BMI 45) Complicates overall prognosis and care    Lactic acidosis Most likely secondary to  metformin use No evidence of sepsis at this time Repeat lactic acid levels   DVT prophylaxis: Lovenox  Code Status: full code  Family Communication: Greater than 50% of time was spent discussing patient's condition and plan of care with him and his daughter at the bedside.  All questions and concerns have been addressed.  They verbalized understanding and agreement with the plan.  CODE STATUS was discussed and he is a full code.  He lists his daughter Marshell Garfinkel as his healthcare power of attorney. Disposition Plan: Back to previous home environment Consults called: none  Status:Observation     Jonelle Bann MD [facility]     07/20/2021, 10:34 PM

## 2021-07-20 NOTE — ED Notes (Signed)
Patient transported to CT 

## 2021-07-20 NOTE — Progress Notes (Signed)
PHARMACIST - PHYSICIAN COMMUNICATION  CONCERNING:  Enoxaparin (Lovenox) for DVT Prophylaxis    RECOMMENDATION: Patient was prescribed enoxaprin 40mg  q24 hours for VTE prophylaxis.   Filed Weights   07/20/21 1851  Weight: 136.1 kg (300 lb)    Body mass index is 45.61 kg/m.  Estimated Creatinine Clearance: 62.8 mL/min (by C-G formula based on SCr of 1.12 mg/dL).   Based on Story patient is candidate for enoxaparin 0.5mg /kg TBW SQ every 24 hours based on BMI being >30.  DESCRIPTION: Pharmacy has adjusted enoxaparin dose per [facility] policy.  Patient is now receiving enoxaparin 0.5 mg/kg every 24 hours   Renda Rolls, PharmD, [facility] 07/20/2021 11:23 PM

## 2021-07-20 NOTE — Consult Note (Signed)
CODE SEPSIS - PHARMACY COMMUNICATION  **Broad Spectrum Antibiotics should be administered within 1 hour of Sepsis diagnosis**  Time Code Sepsis Called/Page Received: 1851  Antibiotics Ordered: ceftriaxone  Time of 1st antibiotic administration: 1943  Additional action taken by pharmacy: Messaged RN   If necessary, Name of Provider/Nurse Contacted: Raelene Bott ,PharmD Clinical Pharmacist  07/20/2021  7:44 PM

## 2021-07-21 DIAGNOSIS — I2510 Atherosclerotic heart disease of native coronary artery without angina pectoris: Secondary | ICD-10-CM | POA: Diagnosis not present

## 2021-07-21 DIAGNOSIS — K5792 Diverticulitis of intestine, part unspecified, without perforation or abscess without bleeding: Secondary | ICD-10-CM

## 2021-07-21 DIAGNOSIS — R112 Nausea with vomiting, unspecified: Secondary | ICD-10-CM | POA: Diagnosis not present

## 2021-07-21 LAB — BASIC METABOLIC PANEL
Anion gap: 9 (ref 5–15)
BUN: 15 mg/dL (ref 8–23)
CO2: 25 mmol/L (ref 22–32)
Calcium: 8.3 mg/dL — ABNORMAL LOW (ref 8.9–10.3)
Chloride: 99 mmol/L (ref 98–111)
Creatinine, Ser: 1.08 mg/dL (ref 0.61–1.24)
GFR, Estimated: >60 mL/min (ref >60)
Glucose, Bld: 162 mg/dL — ABNORMAL HIGH (ref 70–99)
Potassium: 3.8 mmol/L (ref 3.5–5.1)
Sodium: 133 mmol/L — ABNORMAL LOW (ref 135–145)

## 2021-07-21 LAB — CBC
HCT: 36.7 % — ABNORMAL LOW (ref 39.0–52.0)
Hemoglobin: 12.4 g/dL — ABNORMAL LOW (ref 13.0–17.0)
MCH: 28.6 pg (ref 26.0–34.0)
MCHC: 33.8 g/dL (ref 30.0–36.0)
MCV: 84.6 fL (ref 80.0–100.0)
Platelets: 131 10*3/uL — ABNORMAL LOW (ref 150–400)
RBC: 4.34 MIL/uL (ref 4.22–5.81)
RDW: 14 % (ref 11.5–15.5)
WBC: 4 10*3/uL (ref 4.0–10.5)
nRBC: 0 % (ref 0.0–0.2)

## 2021-07-21 LAB — GLUCOSE, CAPILLARY
Glucose-Capillary: 153 mg/dL — ABNORMAL HIGH (ref 70–99)
Glucose-Capillary: 155 mg/dL — ABNORMAL HIGH (ref 70–99)
Glucose-Capillary: 148 mg/dL — ABNORMAL HIGH (ref 70–99)

## 2021-07-21 LAB — LACTIC ACID, PLASMA
Lactic Acid, Venous: 1.4 mmol/L (ref 0.5–1.9)
Lactic Acid, Venous: 1.6 mmol/L (ref 0.5–1.9)

## 2021-07-21 LAB — HEMOGLOBIN A1C
Hgb A1c MFr Bld: 7.2 % — ABNORMAL HIGH (ref 4.8–5.6)
Mean Plasma Glucose: 160 mg/dL

## 2021-07-21 LAB — D-DIMER, QUANTITATIVE: D-Dimer, Quant: 2.15 ug/mL-FEU — ABNORMAL HIGH (ref 0.00–0.50)

## 2021-07-21 MED ORDER — PANTOPRAZOLE SODIUM 40 MG PO TBEC
40.0000 mg | DELAYED_RELEASE_TABLET | Freq: Every day | ORAL | Status: DC
  Administered 2021-07-21 – 2021-07-22 (×2): 40 mg via ORAL
  Filled 2021-07-21 (×2): qty 1

## 2021-07-21 MED ORDER — AMOXICILLIN-POT CLAVULANATE 875-125 MG PO TABS
1.0000 | ORAL_TABLET | Freq: Two times a day (BID) | ORAL | Status: DC
  Administered 2021-07-21 – 2021-07-22 (×3): 1 via ORAL
  Filled 2021-07-21 (×3): qty 1

## 2021-07-21 MED ORDER — RISAQUAD PO CAPS
1.0000 | ORAL_CAPSULE | Freq: Every day | ORAL | Status: DC
  Administered 2021-07-21 – 2021-07-22 (×2): 1 via ORAL
  Filled 2021-07-21 (×2): qty 1

## 2021-07-21 NOTE — Progress Notes (Signed)
[place] [facility] [place] NAME: Dre Gamino    MR#:  [id number]  DATE OF BIRTH:  19-Feb-1932  SUBJECTIVE:  came in with nausea and vomiting. Daughter Levada Dy at bedside. Patient tolerating PO well. Fever of 100.5 mild abdominal tenderness left lower quadrant. No diarrhea. Tolerating regular food patient requesting to go home REVIEW OF SYSTEMS:   Review of Systems  Constitutional:  Positive for fever. Negative for chills and weight loss.  HENT:  Negative for ear discharge, ear pain and nosebleeds.   Eyes:  Negative for blurred vision, pain and discharge.  Respiratory:  Negative for sputum production, shortness of breath, wheezing and stridor.   Cardiovascular:  Negative for chest pain, palpitations, orthopnea and PND.  Gastrointestinal:  Positive for abdominal pain. Negative for diarrhea, nausea and vomiting.  Genitourinary:  Negative for frequency and urgency.  Musculoskeletal:  Negative for back pain and joint pain.  Neurological:  Negative for sensory change, speech change, focal weakness and weakness.  Psychiatric/Behavioral:  Negative for depression and hallucinations. The patient is not nervous/anxious.   Tolerating Diet: Tolerating PT:   DRUG ALLERGIES:   Allergies  Allergen Reactions   Codeine Shortness Of Breath and Rash   Dilaudid [Hydromorphone Hcl] Other (See Comments)    Stopped breathing   Morphine And Related Shortness Of Breath and Rash   Percocet [Oxycodone-Acetaminophen] Shortness Of Breath and Rash   Versed [Midazolam] Anaphylaxis   Vicodin [Hydrocodone-Acetaminophen] Shortness Of Breath and Rash   Ambien [Zolpidem Tartrate]     Hallucinations     VITALS:  Blood pressure (!) 112/39, pulse 64, temperature 98.8 F (37.1 C), resp. rate 18, height 5\' 8"  (1.727 m), weight 136.1 kg, SpO2 99 %.  PHYSICAL EXAMINATION:   Physical Exam  GENERAL:  88 y.o.-year-old patient lying in the bed with no acute distress.  HEENT: Head  atraumatic, normocephalic. Oropharynx and nasopharynx clear.  LUNGS: Normal breath sounds bilaterally, no wheezing, rales, rhonchi.  CARDIOVASCULAR: S1, S2 normal. No murmurs, rubs, or gallops.  ABDOMEN: Soft, mild left lower quadrant tender, nondistended. Bowel sounds present. Abdominal obesity EXTREMITIES: No  edema b/l.    NEUROLOGIC: nonfocal  LABORATORY PANEL:  CBC Recent Labs  Lab 07/21/21 0527  WBC 4.0  HGB 12.4*  HCT 36.7*  PLT 131*    Chemistries  Recent Labs  Lab 07/20/21 1905 07/21/21 0527  NA 132* 133*  K 3.8 3.8  CL 98 99  CO2 23 25  GLUCOSE 181* 162*  BUN 17 15  CREATININE 1.12 1.08  CALCIUM 8.6* 8.3*  AST 42*  --   ALT 23  --   ALKPHOS 48  --   BILITOT 2.5*  --    Cardiac Enzymes No results for input(s): TROPONINI in the last 168 hours. RADIOLOGY:  CT Abdomen Pelvis W Contrast  Result Date: 07/20/2021 CLINICAL DATA:  Abdominal pain EXAM: CT ABDOMEN AND PELVIS WITH CONTRAST TECHNIQUE: Multidetector CT imaging of the abdomen and pelvis was performed using the standard protocol following bolus administration of intravenous contrast. RADIATION DOSE REDUCTION: This exam was performed according to the departmental dose-optimization program which includes automated exposure control, adjustment of the mA and/or kV according to patient size and/or use of iterative reconstruction technique. CONTRAST:  166mL OMNIPAQUE IOHEXOL 300 MG/ML  SOLN COMPARISON:  07/08/2018 FINDINGS: Lower chest: Heart is enlarged in size. Hepatobiliary: Liver measures 18.9 cm. There is fatty infiltration. Gallbladder is unremarkable. Pancreas: No focal abnormality is seen. There is fatty infiltration. Spleen:  Spleen is enlarged measuring 14.6 cm in maximum diameter. There are few calcified granulomas in the spleen. Adrenals/Urinary Tract: Adrenals are unremarkable. There is no hydronephrosis. There are no demonstrable renal or ureteral stones. Urinary bladder is partly obscured by severe beam  hardening artifacts caused by orthopedic hardware in both hips limiting evaluation. Stomach/Bowel: Stomach is unremarkable. Small bowel loops are not dilated. Appendix is not distinctly seen. There is no pericecal inflammation. There is mild wall thickening in the ascending colon. Multiple diverticula are seen in the colon. There is minimal stranding in the fat planes adjacent to the colon in the left lower quadrant. There is no loculated pericolic fluid collection. Vascular/Lymphatic: Atherosclerotic plaques and calcifications are seen in the aorta and its major branches. Reproductive: Evaluation is limited by beam hardening artifacts. Other: There is no ascites or pneumoperitoneum. Bilateral inguinal hernias containing fat are seen, larger on the left side. Small umbilical hernia containing fat is seen. Musculoskeletal: Last lumbar vertebra is transitional. This report assumes partial sacralization of L5 vertebra. There is slight decrease in height of 1 of the lower lumbar vertebral bodies, possibly L3. Schmorl's nodes are seen in the upper and lower endplates, more so in the upper endplate. There is mild sclerosis in the right side of body of L3 vertebra. Degenerative changes are noted with encroachment of neural foramina and spinal stenosis at multiple levels in the lumbar spine, more so at L3-L4 level. IMPRESSION: There is no evidence intestinal obstruction or pneumoperitoneum. There is no hydronephrosis. Diverticulosis of colon. There is minimal pericolic stranding at the junction of sigmoid and descending colon without significant focal wall thickening in colon. This finding may suggest residual changes from previous diverticulitis. Less likely possibility would be mild acute diverticulitis. There is no loculated pericolic fluid collection. There is mild wall thickening in the ascending colon which may be due to incomplete distention or suggest nonspecific colitis. Enlarged fatty liver.  Enlarged spleen.  Lumbar spondylosis with spinal stenosis and encroachment of neural foramina at multiple levels. There is interval mild decrease in height of body of L3 vertebra and interval appearance of Schmorl's nodes and adjacent sclerosis in the body of L3 vertebra. Possibility of active inflammation in the Schmorl's nodes is not excluded. Electronically Signed   By: Elmer Picker M.D.   On: 07/20/2021 20:45   DG Chest Port 1 View  Result Date: 07/20/2021 CLINICAL DATA:  Questionable sepsis EXAM: PORTABLE CHEST 1 VIEW COMPARISON:  07/08/2018 FINDINGS: Cardiomegaly, vascular congestion. No confluent opacities, effusions or overt edema. No acute bony abnormality. IMPRESSION: Cardiomegaly, vascular congestion. Electronically Signed   By: Rolm Baptise M.D.   On: 07/20/2021 19:27   US Abdomen Limited RUQ (LIVER/GB)  Result Date: 07/20/2021 CLINICAL DATA:  Nausea, vomiting EXAM: ULTRASOUND ABDOMEN LIMITED RIGHT UPPER QUADRANT COMPARISON:  CT earlier today FINDINGS: Gallbladder: No gallstones or wall thickening visualized. No sonographic Murphy sign noted by sonographer. Common bile duct: Diameter: Normal caliber, 4 mm Liver: Increased echotexture compatible with fatty infiltration. No focal abnormality or biliary ductal dilatation. Portal vein is patent on color Doppler imaging with normal direction of blood flow towards the liver. Other: None. IMPRESSION: Hepatic steatosis. No acute findings. Electronically Signed   By: Rolm Baptise M.D.   On: 07/20/2021 23:23   ASSESSMENT AND PLAN:   JOSTEN WARMUTH is a 88 y.o. male with medical history significant for morbid obesity, diabetes mellitus, hypertension, BPH, history of diverticular disease who was brought into the ER by EMS for evaluation of fever  and mental status changes. Per his daughter he has had intermittent nausea for about 4 days and on the day of admission he developed emesis which contained food but no blood.  CT scan of abdomen and pelvis -shows no  evidence of intestinal obstruction or pneumoperitoneum.  There is no hydronephrosis.  Diverticulosis of colon.  Mild wall thickening in the ascending colon which may be due to incomplete distention or suggest nonspecific colitis. Chest x-ray reviewed by me shows cardiomegaly with vascular congestion.  Acute mild Diverticulitis H/o Diverticulosis -- came in with nausea and vomiting. Subsided. Patient tolerating PO diet. -- White count normal -- low-grade fever. Patient has some tenderness on palpation left lower quadrant. Will start on PO Augmentin along with probiotic. Discussed with patient's daughter Levada Dy at bedside -- patient wants to go home recommended stay another night and monitor fever curve  Hypertension -- continue amlodipine, metoprolol, losartan  type II diabetes -- on sliding scale -- will resume PO home meds at discharge  CAD -- continue aspirin, statins  morbid obesity -- patient advised lifestyle changes along with dietary restriction. According to the daughter he tends to go to [employer] every single day for breakfast  Procedures: Family communication : Consults : CODE STATUS:  DVT Prophylaxis : Level of care: Telemetry Medical Status is: Observation  The patient remains OBS appropriate and will d/c before 2 midnights.       TOTAL TIME TAKING CARE OF THIS PATIENT: 25 minutes.  >50% time spent on counselling and coordination of care  Note: This dictation was prepared with Dragon dictation along with smaller phrase technology. Any transcriptional errors that result from this process are unintentional.  Fritzi Mandes M.D    [facility]   CC: Primary care physician; Rusty Aus, MD Patient ID: Kristian Covey, male   DOB: Dec 29, 1931, 88 y.o.   MRN: [id number]

## 2021-07-21 NOTE — Progress Notes (Signed)
°   07/21/21 1808  Assess: MEWS Score  Temp (!) 103.1 F (39.5 C)  BP (!) 141/52  Pulse Rate 76  Resp 18  SpO2 94 %  O2 Device Room Air  Assess: MEWS Score  MEWS Temp 2  MEWS Systolic 0  MEWS Pulse 0  MEWS RR 0  MEWS LOC 0  MEWS Score 2  MEWS Score Color Yellow  Assess: if the MEWS score is Yellow or Red  Were vital signs taken at a resting state? Yes  Focused Assessment No change from prior assessment  Does the patient meet 2 or more of the SIRS criteria? No  MEWS guidelines implemented *See Row Information* No, vital signs rechecked  Treat  MEWS Interventions Administered prn meds/treatments  Notify: Charge Nurse/RN  Name of Charge Nurse/RN Notified Hildred Alamin  Date Charge Nurse/RN Notified 07/21/21  Time Charge Nurse/RN Notified 1810  Notify: Provider  Provider Name/Title Dr. Posey Pronto  Date Provider Notified 07/21/21  Time Provider Notified 1810  Notification Type Page  Notification Reason Other (Comment) (fever)  Provider response No new orders  Assess: SIRS CRITERIA  SIRS Temperature  1  SIRS Pulse 0  SIRS Respirations  0  SIRS WBC 0  SIRS Score Sum  1

## 2021-07-22 DIAGNOSIS — R112 Nausea with vomiting, unspecified: Secondary | ICD-10-CM

## 2021-07-22 DIAGNOSIS — K5792 Diverticulitis of intestine, part unspecified, without perforation or abscess without bleeding: Secondary | ICD-10-CM | POA: Diagnosis not present

## 2021-07-22 DIAGNOSIS — I2510 Atherosclerotic heart disease of native coronary artery without angina pectoris: Secondary | ICD-10-CM | POA: Diagnosis not present

## 2021-07-22 LAB — GLUCOSE, CAPILLARY
Glucose-Capillary: 159 mg/dL — ABNORMAL HIGH (ref 70–99)
Glucose-Capillary: 208 mg/dL — ABNORMAL HIGH (ref 70–99)

## 2021-07-22 LAB — URINE CULTURE

## 2021-07-22 MED ORDER — BISACODYL 10 MG RE SUPP: 10.0000 mg | Freq: Every day | RECTAL | Status: DC | PRN

## 2021-07-22 MED ORDER — BISACODYL 10 MG RE SUPP
10.0000 mg | Freq: Once | RECTAL | Status: AC
  Administered 2021-07-22: 10 mg via RECTAL
  Filled 2021-07-22: qty 1

## 2021-07-22 MED ORDER — AMOXICILLIN-POT CLAVULANATE 875-125 MG PO TABS: 1.0000 | ORAL_TABLET | Freq: Two times a day (BID) | ORAL | 0 refills | Status: AC

## 2021-07-22 MED ORDER — GLIMEPIRIDE 1 MG PO TABS
1.0000 mg | ORAL_TABLET | Freq: Every day | ORAL | Status: DC
  Administered 2021-07-22: 1 mg via ORAL
  Filled 2021-07-22: qty 1

## 2021-07-22 MED ORDER — METFORMIN HCL 500 MG PO TABS: 1000.0000 mg | ORAL_TABLET | Freq: Two times a day (BID) | ORAL | Status: DC

## 2021-07-22 MED ORDER — RISAQUAD PO CAPS: 1.0000 | ORAL_CAPSULE | Freq: Every day | ORAL | 0 refills | Status: AC

## 2021-07-22 MED ORDER — PANTOPRAZOLE SODIUM 40 MG PO TBEC: 40.0000 mg | DELAYED_RELEASE_TABLET | Freq: Every day | ORAL | 0 refills | Status: AC

## 2021-07-22 NOTE — Progress Notes (Signed)
Met with the patient and his daughter, he has family support and care, his daughter will transport him, he is set up with [facility], he has DME at home and does not need additonal No additional needs, MOON reviewed

## 2021-07-22 NOTE — Care Management Obs Status (Signed)
[facility] NOTIFICATION   Patient Details  Name: Keith Cain MRN: [id number] Date of Birth: 1932-06-03   Medicare Observation Status Notification Given:  Yes    Conception Oms, RN 07/22/2021, 12:00 PM

## 2021-07-22 NOTE — Discharge Summary (Signed)
Keith Cain NAME: Polo Mcmartin    MR#:  [id number]  DATE OF BIRTH:  [birth year]  DATE OF ADMISSION:  07/20/2021 ADMITTING PHYSICIAN: Collier Bullock, MD  DATE OF DISCHARGE: 07/22/2021  PRIMARY CARE PHYSICIAN: Rusty Aus, MD    ADMISSION DIAGNOSIS:  Refractory nausea and vomiting [R11.2] Acute gastritis [K29.00] Altered mental status, unspecified altered mental status type [R41.82] Nausea and vomiting, unspecified vomiting type [R11.2]  DISCHARGE DIAGNOSIS:  Febrile illness Acute diverticulitis, Sigmoid  SECONDARY DIAGNOSIS:   Past Medical History:  Diagnosis Date   Acute renal failure ([place])    Anemia    Arthritis    RA   Asthma    Cancer ([place])    melanoma   Chronic kidney disease    Complication of anesthesia    HARD TO WAKE UP PATIENT STATES PCP SAID NO IV SEDATION/ HIGH RISK SINCE HARD TO WAKE UP   Diabetes mellitus without complication (HCC)    Edema    Hypertension    Lung nodules    Neuropathy    Oxygen deficiency    WEARS HS   PONV (postoperative nausea and vomiting)    Prostate disorder    Sleep apnea    CPAP    HOSPITAL COURSE:   EVERTTE SONES is a 88 y.o. male with medical history significant for morbid obesity, diabetes mellitus, hypertension, BPH, history of diverticular disease who was brought into the ER by EMS for evaluation of fever and mental status changes. Per his daughter he has had intermittent nausea for about 4 days and on the day of admission he developed emesis which contained food but no blood.   CT scan of abdomen and pelvis -shows no evidence of intestinal obstruction or pneumoperitoneum.  There is no hydronephrosis.  Diverticulosis of colon.  Mild wall thickening in the ascending colon which may be due to incomplete distention or suggest nonspecific colitis. Chest x-ray reviewed by me shows cardiomegaly with vascular congestion.   Acute mild Diverticulitis H/o  Diverticulosis -- came in with nausea and vomiting. Subsided. Patient tolerating PO diet. -- White count normal -- low-grade fever. Patient has some tenderness on palpation left lower quadrant. Will start on PO Augmentin along with probiotic. Discussed with patient's daughter Levada Dy at bedside -- had fever last pm--no fever this am --no cough --tolerated po BF. Had small BM this am --ambulated with Mobility therapist.sats >92%   Hypertension -- continue amlodipine, metoprolol, losartan   type II diabetes -- on sliding scale -- will resume PO home meds at discharge   CAD -- continue aspirin, statins   morbid obesity -- patient advised lifestyle changes along with dietary restriction.  Family communication :dter Levada Dy at bedside CODE STATUS: FULL DVT Prophylaxis :enoxaparin Level of care: Telemetry Medical Status is: Observation   CONSULTS OBTAINED:    DRUG ALLERGIES:   Allergies  Allergen Reactions   Codeine Shortness Of Breath and Rash   Dilaudid [Hydromorphone Hcl] Other (See Comments)    Stopped breathing   Morphine And Related Shortness Of Breath and Rash   Percocet [Oxycodone-Acetaminophen] Shortness Of Breath and Rash   Versed [Midazolam] Anaphylaxis   Vicodin [Hydrocodone-Acetaminophen] Shortness Of Breath and Rash   Ambien [Zolpidem Tartrate]     Hallucinations     DISCHARGE MEDICATIONS:   Allergies as of 07/22/2021       Reactions   Codeine Shortness Of Breath, Rash   Dilaudid [hydromorphone Hcl] Other (See Comments)   Stopped  breathing   Morphine And Related Shortness Of Breath, Rash   Percocet [oxycodone-acetaminophen] Shortness Of Breath, Rash   Versed [midazolam] Anaphylaxis   Vicodin [hydrocodone-acetaminophen] Shortness Of Breath, Rash   Ambien [zolpidem Tartrate]    Hallucinations         Medication List     STOP taking these medications    colchicine 0.6 MG tablet       TAKE these medications    acetaminophen 500 MG  tablet Commonly known as: TYLENOL Take 500 mg by mouth every 8 (eight) hours as needed for moderate pain or headache.   acidophilus Caps capsule Take 1 capsule by mouth daily. Start taking on: July 23, 2021   amLODipine 5 MG tablet Commonly known as: NORVASC Take 5 mg by mouth 2 (two) times daily.   amoxicillin-clavulanate 875-125 MG tablet Commonly known as: AUGMENTIN Take 1 tablet by mouth every 12 (twelve) hours for 6 days.   aspirin EC 81 MG tablet Take 81 mg by mouth daily.   atorvastatin 10 MG tablet Commonly known as: LIPITOR Take 10 mg by mouth at bedtime.   furosemide 20 MG tablet Commonly known as: LASIX Take 20 mg by mouth daily.   gabapentin 100 MG capsule Commonly known as: NEURONTIN Take 200 mg by mouth every evening.   glimepiride 1 MG tablet Commonly known as: AMARYL Take 1 mg by mouth daily.   losartan 100 MG tablet Commonly known as: COZAAR Take 100 mg by mouth daily.   metFORMIN 500 MG tablet Commonly known as: GLUCOPHAGE Take 1,000 mg by mouth 2 (two) times daily with a meal.   metoprolol succinate 50 MG 24 hr tablet Commonly known as: TOPROL-XL Take 50 mg by mouth daily.   montelukast 10 MG tablet Commonly known as: SINGULAIR Take 10 mg by mouth daily.   pantoprazole 40 MG tablet Commonly known as: PROTONIX Take 1 tablet (40 mg total) by mouth daily. Start taking on: July 23, 2021   tamsulosin 0.4 MG Caps capsule Commonly known as: FLOMAX Take 0.4 mg by mouth daily.   vitamin B-12 1000 MCG tablet Commonly known as: CYANOCOBALAMIN Take 1,000 mcg by mouth daily.        If you experience worsening of your admission symptoms, develop shortness of breath, life threatening emergency, suicidal or homicidal thoughts you must seek medical attention immediately by calling 911 or calling your MD immediately  if symptoms less severe.  You Must read complete instructions/literature along with all the possible adverse reactions/side  effects for all the Medicines you take and that have been prescribed to you. Take any new Medicines after you have completely understood and accept all the possible adverse reactions/side effects.   Please note  You were cared for by a hospitalist during your hospital stay. If you have any questions about your discharge medications or the care you received while you were in the hospital after you are discharged, you can call the unit and asked to speak with the hospitalist on call if the hospitalist that took care of you is not available. Once you are discharged, your primary care physician will handle any further medical issues. Please note that NO REFILLS for any discharge medications will be authorized once you are discharged, as it is imperative that you return to your primary care physician (or establish a relationship with a primary care physician if you do not have one) for your aftercare needs so that they can reassess your need for medications and monitor your lab  values. Today   SUBJECTIVE   Doing well. Eager to go home VITAL SIGNS:  Blood pressure (!) 141/64, pulse 68, temperature 98.7 F (37.1 C), resp. rate 16, height 5\' 8"  (1.727 m), weight 136.1 kg, SpO2 100 %.  I/O:   Intake/Output Summary (Last 24 hours) at 07/22/2021 1156 Last data filed at 07/22/2021 1100 Gross per 24 hour  Intake 1080 ml  Output 200 ml  Net 880 ml    PHYSICAL EXAMINATION:  GENERAL:  88 y.o.-year-old patient lying in the bed with no acute distress. obesity LUNGS: Normal breath sounds bilaterally, no wheezing, rales,rhonchi or crepitation.  CARDIOVASCULAR: S1, S2 normal. No murmurs, rubs, or gallops.  ABDOMEN: Soft, non-tender, non-distended. Bowel sounds present.  EXTREMITIES: No  clubbing.  NEUROLOGIC: non-focal PSYCHIATRIC:  patient is alert and awake  SKIN: No obvious rash, lesion, or ulcer.   DATA REVIEW:   CBC  Recent Labs  Lab 07/21/21 0527  WBC 4.0  HGB 12.4*  HCT 36.7*  PLT 131*     Chemistries  Recent Labs  Lab 07/20/21 1905 07/21/21 0527  NA 132* 133*  K 3.8 3.8  CL 98 99  CO2 23 25  GLUCOSE 181* 162*  BUN 17 15  CREATININE 1.12 1.08  CALCIUM 8.6* 8.3*  AST 42*  --   ALT 23  --   ALKPHOS 48  --   BILITOT 2.5*  --     Microbiology Results   Recent Results (from the past 240 hour(s))  Blood Culture (routine x 2)     Status: None (Preliminary result)   Collection Time: 07/20/21  7:05 PM   Specimen: BLOOD  Result Value Ref Range Status   Specimen Description BLOOD BLOOD RIGHT FOREARM  Final   Special Requests BOTTLES DRAWN AEROBIC AND ANAEROBIC BCAV  Final   Culture   Final    NO GROWTH 2 DAYS Performed at [facility], [address] [place], [place] [address]    Report Status PENDING  Incomplete  Blood Culture (routine x 2)     Status: None (Preliminary result)   Collection Time: 07/20/21  7:53 PM   Specimen: BLOOD  Result Value Ref Range Status   Specimen Description BLOOD [facility]  Final   Special Requests BOTTLES DRAWN AEROBIC AND ANAEROBIC BCAV  Final   Culture   Final    NO GROWTH 2 DAYS Performed at [facility], [address], [place],  [address]    Report Status PENDING  Incomplete  Resp Panel by RT-PCR (Flu A&B, Covid) Nasopharyngeal Swab     Status: None   Collection Time: 07/20/21  8:44 PM   Specimen: Nasopharyngeal Swab; Nasopharyngeal(NP) swabs in vial transport medium  Result Value Ref Range Status   SARS Coronavirus 2 by RT PCR NEGATIVE NEGATIVE Final    Comment: (NOTE) SARS-CoV-2 target nucleic acids are NOT DETECTED.  The SARS-CoV-2 RNA is generally detectable in upper respiratory specimens during the acute phase of infection. The lowest concentration of SARS-CoV-2 viral copies this assay can detect is 138 copies/mL. A negative result does not preclude SARS-Cov-2 infection and should not be used as the sole basis for treatment or other patient management decisions. A negative result may occur  with  improper specimen collection/handling, submission of specimen other than nasopharyngeal swab, presence of viral mutation(s) within the areas targeted by this assay, and inadequate number of viral copies(<138 copies/mL). A negative result must be combined with clinical observations, patient history, and epidemiological information. The expected result  is Negative.  Fact Sheet for Patients:  EntrepreneurPulse.com.au  Fact Sheet for Healthcare Providers:  IncredibleEmployment.be  This test is no t yet approved or cleared by the [place] FDA and  has been authorized for detection and/or diagnosis of SARS-CoV-2 by FDA under an Emergency Use Authorization (EUA). This EUA will remain  in effect (meaning this test can be used) for the duration of the COVID-19 declaration under Section 564(b)(1) of the Act, 21 U.S.C.section 360bbb-3(b)(1), unless the authorization is terminated  or revoked sooner.       Influenza A by PCR NEGATIVE NEGATIVE Final   Influenza B by PCR NEGATIVE NEGATIVE Final    Comment: (NOTE) The Xpert Xpress SARS-CoV-2/FLU/RSV plus assay is intended as an aid in the diagnosis of influenza from Nasopharyngeal swab specimens and should not be used as a sole basis for treatment. Nasal washings and aspirates are unacceptable for Xpert Xpress SARS-CoV-2/FLU/RSV testing.  Fact Sheet for Patients: EntrepreneurPulse.com.au  Fact Sheet for Healthcare Providers: IncredibleEmployment.be  This test is not yet approved or cleared by the [place] FDA and has been authorized for detection and/or diagnosis of SARS-CoV-2 by FDA under an Emergency Use Authorization (EUA). This EUA will remain in effect (meaning this test can be used) for the duration of the COVID-19 declaration under Section 564(b)(1) of the Act, 21 U.S.C. section 360bbb-3(b)(1), unless the authorization is terminated  or revoked.  Performed at [facility], [address] [place], [place] [address]   Urine Culture     Status: Abnormal   Collection Time: 07/20/21  8:55 PM   Specimen: Urine, Random  Result Value Ref Range Status   Specimen Description   Final    URINE, RANDOM Performed at [facility], [address] [place], [place] [address]    Special Requests   Final    NONE Performed at [facility], [address] [place], [place] [address]    Culture MULTIPLE SPECIES PRESENT, SUGGEST RECOLLECTION (A)  Final   Report Status 07/22/2021 FINAL  Final    RADIOLOGY:  CT Abdomen Pelvis W Contrast  Result Date: 07/20/2021 CLINICAL DATA:  Abdominal pain EXAM: CT ABDOMEN AND PELVIS WITH CONTRAST TECHNIQUE: Multidetector CT imaging of the abdomen and pelvis was performed using the standard protocol following bolus administration of intravenous contrast. RADIATION DOSE REDUCTION: This exam was performed according to the departmental dose-optimization program which includes automated exposure control, adjustment of the mA and/or kV according to patient size and/or use of iterative reconstruction technique. CONTRAST:  163mL OMNIPAQUE IOHEXOL 300 MG/ML  SOLN COMPARISON:  07/08/2018 FINDINGS: Lower chest: Heart is enlarged in size. Hepatobiliary: Liver measures 18.9 cm. There is fatty infiltration. Gallbladder is unremarkable. Pancreas: No focal abnormality is seen. There is fatty infiltration. Spleen: Spleen is enlarged measuring 14.6 cm in maximum diameter. There are few calcified granulomas in the spleen. Adrenals/Urinary Tract: Adrenals are unremarkable. There is no hydronephrosis. There are no demonstrable renal or ureteral stones. Urinary bladder is partly obscured by severe beam hardening artifacts caused by orthopedic hardware in both hips limiting evaluation. Stomach/Bowel: Stomach is unremarkable. Small bowel loops are not dilated. Appendix is not distinctly seen. There is  no pericecal inflammation. There is mild wall thickening in the ascending colon. Multiple diverticula are seen in the colon. There is minimal stranding in the fat planes adjacent to the colon in the left lower quadrant. There is no loculated pericolic fluid collection. Vascular/Lymphatic: Atherosclerotic plaques and calcifications are seen in the aorta and its  major branches. Reproductive: Evaluation is limited by beam hardening artifacts. Other: There is no ascites or pneumoperitoneum. Bilateral inguinal hernias containing fat are seen, larger on the left side. Small umbilical hernia containing fat is seen. Musculoskeletal: Last lumbar vertebra is transitional. This report assumes partial sacralization of L5 vertebra. There is slight decrease in height of 1 of the lower lumbar vertebral bodies, possibly L3. Schmorl's nodes are seen in the upper and lower endplates, more so in the upper endplate. There is mild sclerosis in the right side of body of L3 vertebra. Degenerative changes are noted with encroachment of neural foramina and spinal stenosis at multiple levels in the lumbar spine, more so at L3-L4 level. IMPRESSION: There is no evidence intestinal obstruction or pneumoperitoneum. There is no hydronephrosis. Diverticulosis of colon. There is minimal pericolic stranding at the junction of sigmoid and descending colon without significant focal wall thickening in colon. This finding may suggest residual changes from previous diverticulitis. Less likely possibility would be mild acute diverticulitis. There is no loculated pericolic fluid collection. There is mild wall thickening in the ascending colon which may be due to incomplete distention or suggest nonspecific colitis. Enlarged fatty liver.  Enlarged spleen. Lumbar spondylosis with spinal stenosis and encroachment of neural foramina at multiple levels. There is interval mild decrease in height of body of L3 vertebra and interval appearance of Schmorl's nodes  and adjacent sclerosis in the body of L3 vertebra. Possibility of active inflammation in the Schmorl's nodes is not excluded. Electronically Signed   By: Elmer Picker M.D.   On: 07/20/2021 20:45   DG Chest Port 1 View  Result Date: 07/20/2021 CLINICAL DATA:  Questionable sepsis EXAM: PORTABLE CHEST 1 VIEW COMPARISON:  07/08/2018 FINDINGS: Cardiomegaly, vascular congestion. No confluent opacities, effusions or overt edema. No acute bony abnormality. IMPRESSION: Cardiomegaly, vascular congestion. Electronically Signed   By: Rolm Baptise M.D.   On: 07/20/2021 19:27   US Abdomen Limited RUQ (LIVER/GB)  Result Date: 07/20/2021 CLINICAL DATA:  Nausea, vomiting EXAM: ULTRASOUND ABDOMEN LIMITED RIGHT UPPER QUADRANT COMPARISON:  CT earlier today FINDINGS: Gallbladder: No gallstones or wall thickening visualized. No sonographic Murphy sign noted by sonographer. Common bile duct: Diameter: Normal caliber, 4 mm Liver: Increased echotexture compatible with fatty infiltration. No focal abnormality or biliary ductal dilatation. Portal vein is patent on color Doppler imaging with normal direction of blood flow towards the liver. Other: None. IMPRESSION: Hepatic steatosis. No acute findings. Electronically Signed   By: Rolm Baptise M.D.   On: 07/20/2021 23:23     CODE STATUS:     Code Status Orders  (From admission, onward)           Start     Ordered   07/20/21 2229  Full code  Continuous        07/20/21 2230           Code Status History     This patient has a current code status but no historical code status.        TOTAL TIME TAKING CARE OF THIS PATIENT: 35 minutes.    Fritzi Mandes M.D  [facility]    CC: Primary care physician; Rusty Aus, MD

## 2021-07-22 NOTE — Progress Notes (Addendum)
Mobility Specialist - Progress Note   07/22/21 1100  Mobility  Activity Ambulated with assistance in hallway;Transferred from bed to chair  Level of Assistance Standby assist, set-up cues, supervision of patient - no hands on  Assistive Device Front wheel walker  Distance Ambulated (ft) 80 ft  Activity Response Tolerated fair  $Mobility charge 1 Mobility    During mobility: 80 HR, 94% SpO2 Post-mobility: 83 HR, 96% SpO2   Pt lying in bed upon arrival, utilizing RA. Pt able to sit EOB modI, stands with supervision. Impulsive--pt tends to move quicker especially when fatigued to hurry and sit. VC for hand placement and proper use of RW to correct anterior lean. Cues to keep RW on floor and sequencing turns. Pt limited by SOB and fatigue, requiring frequent rest breaks---15' x 4 and 10' x 2. PLB educated with good carryover. O2 maintained > 93% throughout session. Pt left in chair with family at bedside.    Kathee Delton Mobility Specialist 07/22/21, 11:21 AM

## 2021-07-25 LAB — CULTURE, BLOOD (ROUTINE X 2)
Culture: NO GROWTH
Culture: NO GROWTH

## 2024-07-15 ENCOUNTER — Emergency Department (HOSPITAL_COMMUNITY)

## 2024-07-15 ENCOUNTER — Emergency Department (HOSPITAL_COMMUNITY)
Admission: EM | Admit: 2024-07-15 | Discharge: 2024-07-15 | Disposition: A | Discharge status: Home / Self Care | Attending: Emergency Medicine | Admitting: Emergency Medicine

## 2024-07-15 ENCOUNTER — Other Ambulatory Visit: Payer: Self-pay

## 2024-07-15 DIAGNOSIS — Z7982 Long term (current) use of aspirin: Secondary | ICD-10-CM | POA: Insufficient documentation

## 2024-07-15 DIAGNOSIS — I10 Essential (primary) hypertension: Secondary | ICD-10-CM | POA: Insufficient documentation

## 2024-07-15 DIAGNOSIS — Z7984 Long term (current) use of oral hypoglycemic drugs: Secondary | ICD-10-CM | POA: Insufficient documentation

## 2024-07-15 DIAGNOSIS — Z79899 Other long term (current) drug therapy: Secondary | ICD-10-CM | POA: Insufficient documentation

## 2024-07-15 DIAGNOSIS — R079 Chest pain, unspecified: Secondary | ICD-10-CM | POA: Insufficient documentation

## 2024-07-15 DIAGNOSIS — E119 Type 2 diabetes mellitus without complications: Secondary | ICD-10-CM | POA: Diagnosis not present

## 2024-07-15 LAB — COMPREHENSIVE METABOLIC PANEL WITH GFR
ALT: 19 U/L (ref 0–44)
AST: 26 U/L (ref 15–41)
Albumin: 4.1 g/dL (ref 3.5–5.0)
Alkaline Phosphatase: 79 U/L (ref 38–126)
Anion gap: 14 (ref 5–15)
BUN: 15 mg/dL (ref 8–23)
CO2: 24 mmol/L (ref 22–32)
Calcium: 9.4 mg/dL (ref 8.9–10.3)
Chloride: 101 mmol/L (ref 98–111)
Creatinine, Ser: 1.15 mg/dL (ref 0.61–1.24)
GFR, Estimated: >60 mL/min
Glucose, Bld: 134 mg/dL — ABNORMAL HIGH (ref 70–99)
Potassium: 4.1 mmol/L (ref 3.5–5.1)
Sodium: 140 mmol/L (ref 135–145)
Total Bilirubin: 1.2 mg/dL (ref 0.0–1.2)
Total Protein: 6.9 g/dL (ref 6.5–8.1)

## 2024-07-15 LAB — CBC WITH DIFFERENTIAL/PLATELET
Abs Immature Granulocytes: 0.03 K/uL (ref 0.00–0.07)
Basophils Absolute: 0.1 K/uL (ref 0.0–0.1)
Basophils Relative: 1 %
Eosinophils Absolute: 0.4 K/uL (ref 0.0–0.5)
Eosinophils Relative: 4 %
HCT: 38.1 % — ABNORMAL LOW (ref 39.0–52.0)
Hemoglobin: 12.6 g/dL — ABNORMAL LOW (ref 13.0–17.0)
Immature Granulocytes: 0 %
Lymphocytes Relative: 11 %
Lymphs Abs: 1.1 K/uL (ref 0.7–4.0)
MCH: 28.9 pg (ref 26.0–34.0)
MCHC: 33.1 g/dL (ref 30.0–36.0)
MCV: 87.4 fL (ref 80.0–100.0)
Monocytes Absolute: 0.8 K/uL (ref 0.1–1.0)
Monocytes Relative: 8 %
Neutro Abs: 7.7 K/uL (ref 1.7–7.7)
Neutrophils Relative %: 76 %
Platelets: 216 K/uL (ref 150–400)
RBC: 4.36 MIL/uL (ref 4.22–5.81)
RDW: 13.3 % (ref 11.5–15.5)
WBC: 10.1 K/uL (ref 4.0–10.5)
nRBC: 0 % (ref 0.0–0.2)

## 2024-07-15 LAB — I-STAT CHEM 8, ED
BUN: 15 mg/dL (ref 8–23)
Calcium, Ion: 1.07 mmol/L — ABNORMAL LOW (ref 1.15–1.40)
Chloride: 102 mmol/L (ref 98–111)
Creatinine, Ser: 1.2 mg/dL (ref 0.61–1.24)
Glucose, Bld: 134 mg/dL — ABNORMAL HIGH (ref 70–99)
HCT: 38 % — ABNORMAL LOW (ref 39.0–52.0)
Hemoglobin: 12.9 g/dL — ABNORMAL LOW (ref 13.0–17.0)
Potassium: 4 mmol/L (ref 3.5–5.1)
Sodium: 139 mmol/L (ref 135–145)
TCO2: 24 mmol/L (ref 22–32)

## 2024-07-15 LAB — TROPONIN T, HIGH SENSITIVITY
Troponin T High Sensitivity: 25 ng/L — ABNORMAL HIGH (ref 0–19)
Troponin T High Sensitivity: 21 ng/L — ABNORMAL HIGH (ref 0–19)

## 2024-07-15 LAB — LIPASE, BLOOD: Lipase: 16 U/L (ref 11–51)

## 2024-07-15 MED ORDER — IOHEXOL 350 MG/ML SOLN
100.0000 mL | Freq: Once | INTRAVENOUS | Status: AC | PRN
  Administered 2024-07-15: 100 mL via INTRAVENOUS

## 2024-07-15 MED ORDER — ACETAMINOPHEN 500 MG PO TABS
1000.0000 mg | ORAL_TABLET | Freq: Once | ORAL | Status: AC
  Administered 2024-07-15: 1000 mg via ORAL
  Filled 2024-07-15: qty 2

## 2024-07-15 MED ORDER — FENTANYL CITRATE (PF) 50 MCG/ML IJ SOSY
50.0000 ug | PREFILLED_SYRINGE | Freq: Once | INTRAMUSCULAR | Status: DC
  Filled 2024-07-15: qty 1

## 2024-07-15 NOTE — ED Notes (Signed)
 Provider Emil DO at bedside triage questions will resume on completion of provider assessment.

## 2024-07-15 NOTE — ED Provider Notes (Signed)
 " [name] Provider Note   CSN: [id number] Arrival date & time: 07/15/24  9090     Patient presents with: No chief complaint on file.   Keith Cain is a 89 y.o. male.   89 yo M with a chief plaint of chest pain.  He said he woke up at 6 this morning which is normal for him he felt he was having new pain in his left shoulder and since then it moved into his upper chest and then moved into the center of his chest.  Describes it as severe.  10 out of 10.  Denies any radiation to his back.  Seems to be a bit worse with breathing.  He denies trauma to the area.  Patient denies history of MI, denies smoking.  His father had 4 heart attacks.  He is not sure how old he was.  His brother had a heart attack that was fatal in his 15s.  He has hypertension, DM and hyperlipidemia.  Patient denies history of PE or DVT denies hemoptysis denies unilateral lower extremity edema denies recent surgery immobilization hospitalization estrogen use or history of cancer.          Prior to Admission medications  Medication Sig Start Date End Date Taking? Authorizing Provider  acetaminophen  (TYLENOL ) 500 MG tablet Take 500 mg by mouth every 8 (eight) hours as needed for moderate pain or headache.     [provider]  acidophilus (RISAQUAD) CAPS capsule Take 1 capsule by mouth daily. 07/23/21   Patel, Sona, MD  amLODipine  (NORVASC ) 5 MG tablet Take 5 mg by mouth 2 (two) times daily.    [provider]  aspirin  EC 81 MG tablet Take 81 mg by mouth daily.    [provider]  atorvastatin  (LIPITOR) 10 MG tablet Take 10 mg by mouth at bedtime.     [provider]  furosemide  (LASIX ) 20 MG tablet Take 20 mg by mouth daily.     [provider]  gabapentin  (NEURONTIN ) 100 MG capsule Take 200 mg by mouth every evening. 03/13/21   [provider]  glimepiride  (AMARYL ) 1 MG tablet Take 1 mg by mouth daily.      [provider]  losartan  (COZAAR ) 100 MG tablet Take 100 mg by mouth daily.    [provider]  metFORMIN  (GLUCOPHAGE ) 500 MG tablet Take 1,000 mg by mouth 2 (two) times daily with a meal.    [provider]  metoprolol  succinate (TOPROL -XL) 50 MG 24 hr tablet Take 50 mg by mouth daily.     [provider]  montelukast  (SINGULAIR ) 10 MG tablet Take 10 mg by mouth daily. 07/14/21   [provider]  pantoprazole  (PROTONIX ) 40 MG tablet Take 1 tablet (40 mg total) by mouth daily. 07/23/21   Patel, Sona, MD  tamsulosin  (FLOMAX ) 0.4 MG CAPS capsule Take 0.4 mg by mouth daily.     [provider]  vitamin B-12 (CYANOCOBALAMIN ) 1000 MCG tablet Take 1,000 mcg by mouth daily.    [provider]    Allergies: Codeine, Dilaudid [hydromorphone hcl], Morphine and codeine, Percocet [oxycodone-acetaminophen ], Versed [midazolam], Vicodin [hydrocodone-acetaminophen ], and Ambien [zolpidem tartrate]    Review of Systems  Updated Vital Signs BP (!) 164/93   Pulse 65   Temp 99.2 F (37.3 C) (Oral)   Resp 13   Ht 5' 10 (1.778 m)   Wt 127 kg   SpO2 98%   BMI  40.18 kg/m   Physical Exam Vitals and nursing note reviewed.  Constitutional:      Appearance: He is well-developed.  HENT:     Head: Normocephalic and atraumatic.  Eyes:     Pupils: Pupils are equal, round, and reactive to light.  Neck:     Vascular: No JVD.  Cardiovascular:     Rate and Rhythm: Normal rate and regular rhythm.     Heart sounds: No murmur heard.    No friction rub. No gallop.  Pulmonary:     Effort: No respiratory distress.     Breath sounds: No wheezing.  Abdominal:     General: There is no distension.     Tenderness: There is no abdominal tenderness. There is no guarding or rebound.  Musculoskeletal:        General: Normal range of motion.     Cervical back: Normal range of motion and neck supple.     Comments: I do not appreciate any obvious palpable  tenderness to the chest.  I am able to range the left shoulder without eliciting any obvious discomfort.  Skin:    Coloration: Skin is not pale.     Findings: No rash.  Neurological:     Mental Status: He is alert and oriented to person, place, and time.  Psychiatric:        Behavior: Behavior normal.     (all labs ordered are listed, but only abnormal results are displayed) Labs Reviewed  CBC WITH DIFFERENTIAL/PLATELET - Abnormal; Notable for the following components:      Result Value   Hemoglobin 12.6 (*)    HCT 38.1 (*)    All other components within normal limits  COMPREHENSIVE METABOLIC PANEL WITH GFR - Abnormal; Notable for the following components:   Glucose, Bld 134 (*)    All other components within normal limits  I-STAT CHEM 8, ED - Abnormal; Notable for the following components:   Glucose, Bld 134 (*)    Calcium , Ion 1.07 (*)    Hemoglobin 12.9 (*)    HCT 38.0 (*)    All other components within normal limits  TROPONIN T, HIGH SENSITIVITY - Abnormal; Notable for the following components:   Troponin T High Sensitivity 25 (*)    All other components within normal limits  TROPONIN T, HIGH SENSITIVITY - Abnormal; Notable for the following components:   Troponin T High Sensitivity 21 (*)    All other components within normal limits  LIPASE, BLOOD    EKG: EKG Interpretation Date/Time:  Wednesday July 15 2024 09:21:33 EST Ventricular Rate:  70 PR Interval:  190 QRS Duration:  99 QT Interval:  422 QTC Calculation: 456 R Axis:   100  Text Interpretation: Sinus rhythm Right axis deviation No significant change since last tracing Confirmed by Emil Share [phone]) on 07/15/2024 9:58:24 AM  Radiology: CT Angio Chest/Abd/Pel for Dissection W and/or Wo Contrast Result Date: 07/15/2024 CLINICAL DATA:  Acute aortic syndrome EXAM: CT ANGIOGRAPHY CHEST, ABDOMEN AND PELVIS TECHNIQUE: Non-contrast CT of the chest was initially obtained. Multidetector CT imaging through the  chest, abdomen and pelvis was performed using the standard protocol during bolus administration of intravenous contrast. Multiplanar reconstructed images and MIPs were obtained and reviewed to evaluate the vascular anatomy. RADIATION DOSE REDUCTION: This exam was performed according to the departmental dose-optimization program which includes automated exposure control, adjustment of the mA and/or kV according to patient size and/or use of iterative reconstruction technique. CONTRAST:  OMNIPAQUE  IOHEXOL  350  MG/ML SOLN COMPARISON:  July 20, 2021.  February 16, 2013. FINDINGS: CTA CHEST FINDINGS Cardiovascular: Atherosclerosis of thoracic aorta is noted without aneurysm or dissection. No definite evidence of pulmonary embolus. Cardiac size. Small pericardial effusion. Mild coronary artery calcifications are noted. Mediastinum/Nodes: No enlarged mediastinal, hilar, or axillary lymph nodes. Thyroid gland, trachea, and esophagus demonstrate no significant findings. Lungs/Pleura: No pneumothorax or pleural effusion is noted. 4 mm nodule is noted in right lower lobe. This is best seen on image number 108 of series 7. No significant consolidative process is noted. Musculoskeletal: No chest wall abnormality. No acute or significant osseous findings. Review of the MIP images confirms the above findings. CTA ABDOMEN AND PELVIS FINDINGS VASCULAR Aorta: Atherosclerosis of abdominal aorta is noted without aneurysm or dissection. Celiac: Patent without evidence of aneurysm, dissection, vasculitis or significant stenosis. SMA: Patent without evidence of aneurysm, dissection, vasculitis or significant stenosis. Renals: Both renal arteries are patent without evidence of aneurysm, dissection, vasculitis, fibromuscular dysplasia or significant stenosis. IMA: Severe narrowing is noted at origin secondary to calcified plaque. Inflow: Patent without evidence of aneurysm, dissection, vasculitis or significant stenosis. Veins: No  obvious venous abnormality within the limitations of this arterial phase study. Review of the MIP images confirms the above findings. NON-VASCULAR Hepatobiliary: No focal liver abnormality is seen. No gallstones, gallbladder wall thickening, or biliary dilatation. Pancreas: Unremarkable. No pancreatic ductal dilatation or surrounding inflammatory changes. Spleen: Normal in size without focal abnormality. Adrenals/Urinary Tract: Adrenal glands are unremarkable. Kidneys are normal, without renal calculi, focal lesion, or hydronephrosis. Bladder is unremarkable. Stomach/Bowel: Status post appendectomy. Stomach is unremarkable. There is no evidence of bowel obstruction. Diverticulosis of descending and sigmoid colon is noted. No definite bowel inflammation noted. Lymphatic: No definite adenopathy is noted. Reproductive: Prostate gland is not well visualized due to scatter artifact arising from bilateral hip arthroplasties. Other: Moderate size fat containing left inguinal hernia is noted. No ascites is noted. Musculoskeletal: Status post bilateral total hip arthroplasties as noted above. No acute osseous abnormality is noted. Review of the MIP images confirms the above findings. IMPRESSION: 1. No evidence of thoracic or abdominal aortic dissection or aneurysm. 2. Small pericardial effusion. 3. Mild coronary artery calcifications are noted. 4. 4 mm nodule is noted in right lower lobe. No follow-up needed if patient is low-risk.This recommendation follows the consensus statement: Guidelines for Management of Incidental Pulmonary Nodules Detected on CT Images: From the Fleischner Society 2017; Radiology 2017; 284:228-243. 5. 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 6. Diverticulosis of descending and sigmoid colon without inflammation. 7. Moderate size fat containing left inguinal hernia. 8. Aortic atherosclerosis. Aortic Atherosclerosis (ICD10-I70.0). Electronically  Signed   By: Lynwood Landy Raddle M.D.   On: 07/15/2024 12:03   DG Chest Port 1 View Result Date: 07/15/2024 CLINICAL DATA:  Chest pain. EXAM: PORTABLE CHEST 1 VIEW COMPARISON:  Chest radiograph dated 07/20/2021. FINDINGS: There is cardiomegaly. Mild vascular congestion. No focal consolidation, pleural effusion or pneumothorax. No acute osseous pathology. IMPRESSION: Cardiomegaly with mild vascular congestion. Electronically Signed   By: Vanetta Chou M.D.   On: 07/15/2024 10:04     Procedures   Medications Ordered in the ED  fentaNYL  (SUBLIMAZE ) injection 50 mcg (50 mcg Intravenous Patient Refused/Not Given 07/15/24 0943)  acetaminophen  (TYLENOL ) tablet 1,000 mg (1,000 mg Oral Given 07/15/24 0948)  iohexol  (OMNIPAQUE ) 350 MG/ML injection 100 mL (100 mLs Intravenous Contrast Given 07/15/24 1144)  Medical Decision Making Amount and/or Complexity of Data Reviewed Labs: ordered. Radiology: ordered.  Risk OTC drugs. Prescription drug management.   89 yo M with a chief complaint of chest pain.  Started this morning when he woke up.  Sounds good could be musculoskeletal as it is worse with breathing however I am unable to reproduce it with any positions or palpation.  He feels like it has been moving which is also concerning.  Mildly hypertensive here.  Will obtain a laboratory evaluation and CT imaging.  2 troponins are flat.  CT angiogram of the chest is negative for pulmonary embolism or dissection.  No significant electrolyte abnormalities no acute anemia.  Patient has had quite a bit of belching per family.  He does feel better now.  Perhaps indigestion.  CT read was concerning for possible pericardial effusion.  Bedside ultrasound without obvious effusion.  No hemodynamic changes. Cards follow up.  2:53 PM:  I have discussed the diagnosis/risks/treatment options with the patient and family.  Evaluation and diagnostic testing in the emergency department  does not suggest an emergent condition requiring admission or immediate intervention beyond what has been performed at this time.  They will follow up with Cards. We also discussed returning to the ED immediately if new or worsening sx occur. We discussed the sx which are most concerning (e.g., sudden worsening pain, fever, inability to tolerate by mouth) that necessitate immediate return. Medications administered to the patient during their visit and any new prescriptions provided to the patient are listed below.  Medications given during this visit Medications  fentaNYL  (SUBLIMAZE ) injection 50 mcg (50 mcg Intravenous Patient Refused/Not Given 07/15/24 0943)  acetaminophen  (TYLENOL ) tablet 1,000 mg (1,000 mg Oral Given 07/15/24 0948)  iohexol  (OMNIPAQUE ) 350 MG/ML injection 100 mL (100 mLs Intravenous Contrast Given 07/15/24 1144)     The patient appears reasonably screen and/or stabilized for discharge and I doubt any other medical condition or other [facility] requiring further screening, evaluation, or treatment in the ED at this time prior to discharge.       Final diagnoses:  Nonspecific chest pain    ED Discharge Orders          Ordered    Ambulatory referral to Cardiology       Comments: If you have not heard from the Cardiology office within the next 72 hours please call [phone].   07/15/24 1451               Emil Share, DO 07/15/24 1453  "

## 2024-07-15 NOTE — Discharge Instructions (Signed)
 Your test look reassuring here.  Seems unlikely to be a heart attack or a blood clot in the lung or pneumonia or a tear in your big blood vessel that runs down your back.  Please follow-up with cardiology in clinic.  I placed an order for them to call you on the phone to set up an appointment.

## 2024-07-15 NOTE — ED Notes (Signed)
 Per provider conversation, pt added to [facility] monitoring services.

## 2024-07-15 NOTE — ED Triage Notes (Signed)
 Pt came from home to lobby, at 0600 pt awoke and left was hurting wit constant pain  I thought I slept on it wrong but it wouldn't stop pt left left house at 0810 when pain would not subside. Pt has sob at baseline.

## 2024-07-15 NOTE — ED Notes (Signed)
 Pt leaving with daughter in no new onset distress. Pt has mild pain but wishes to medicate at home. Provider Emil DO aware of pressure. Paperwork reviewed with pt no questions or concerns at this time.

## 2024-07-15 NOTE — ED Notes (Signed)
 CT called ETA 30 minutes.

## 2024-07-16 NOTE — Progress Notes (Signed)
 "                                    Patient Profile:   Keith Cain  is a 89 y.o.  male Chief Complaint  Patient presents with   Hospital Follow Up    Seen in ED yesterday- Diagnoses  Nonspecific chest pain    Per daughter fluid around the heart. He is scheduled to see Cardiology.      PROBLEM LIST: Past Medical History:  Diagnosis Date   Allergy    Anemia 01/01/0000   Intrrmittent   BPH (benign prostatic hyperplasia)    Chronic kidney disease    Diverticulitis    DM (diabetes mellitus) type II controlled with renal manifestation (CMS/HHS-HCC) 12/30/2013   Gout 12/30/2013   History of cataract 98989999   Removed   Hyperlipidemia 01/01/0000   Unknown   Hypertension    Melanoma (CMS/HHS-HCC)    OSA on CPAP 07/13/2015   Auto CPAP 5-20, 10/16    Past Surgical History:  Procedure Laterality Date   APPENDECTOMY     BACK SURGERY     CATARACT EXTRACTION     FRACTURE SURGERY     JOINT REPLACEMENT     MELANOMA REMOVED     RIGHT FOREARM SURGERY DUE TO TRAUMA     RIGHT ROTATOR CUFF SURGERY     RIGHT TOTAL HIP REPLACEMENT      ALLERGIES: Allergies  Allergen Reactions   Codeine Shortness Of Breath and Rash   Hydromorphone Hcl Unknown    Stopped breathing   Morphine Other (See Comments), Rash and Shortness Of Breath    Sweating   Opioids - Morphine Analogues Rash and Shortness Of Breath   Percocet [Oxycodone-Acetaminophen ] Nausea, Hallucination, Rash and Shortness Of Breath   Versed [Midazolam] Anaphylaxis   Vicodin [Hydrocodone-Acetaminophen ] Nausea, Rash and Shortness Of Breath   Ambien [Zolpidem] Hallucination   Bactrim [Sulfamethoxazole-Trimethoprim] Headache    Elevated Blood pressure    Zolpidem Tartrate Unknown    Hallucinations     CURRENT MEDICATIONS: Current Outpatient Medications  Medication Sig Dispense Refill   acetaminophen  (TYLENOL  ARTHRITIS PAIN ORAL) Take by mouth as needed     aspirin  81 MG EC tablet  Take 81 mg by mouth once daily.     atorvastatin  (LIPITOR) 10 MG tablet Take 1 tablet (10 mg total) by mouth at bedtime 90 tablet 3   blood glucose diagnostic test strip 1 each (1 strip total) 3 (three) times daily FREESTYLE LITE Dx E11.51 100 each 2   clotrimazole-betamethasone (LOTRISONE) 1-0.05 % cream Apply topically 2 (two) times daily 45 g 1   colchicine  (MITIGARE ) 0.6 mg Cap Take 1 capsule by mouth 2 (two) times daily as needed 60 capsule 5   cyanocobalamin  (VITAMIN B12) 500 MCG tablet Take 1,000 mcg by mouth once daily Once a week.     doxazosin (CARDURA) 4 MG tablet Take 1 tablet (4 mg total) by mouth at bedtime 90 tablet 3   FUROsemide  (LASIX ) 20 MG tablet Take 2 tablets (40 mg total) by mouth once daily ON HOLD (Patient taking differently: Take 40 mg by mouth once daily) 180 tablet 3   gabapentin  (NEURONTIN ) 100 MG capsule Take 2 capsules (200 mg total) by mouth at bedtime 180 capsule 3   glimepiride  (AMARYL ) 1 MG tablet Take 0.5 tablets (0.5 mg total) by mouth daily with breakfast 45 tablet 3   L. acidophilus/Bifid. animalis  32 billion cell Cap Take 1 capsule by mouth once daily     losartan  (COZAAR ) 100 MG tablet Take 1 tablet (100 mg total) by mouth once daily 90 tablet 3   metFORMIN  (GLUCOPHAGE ) 500 MG tablet Take 2 tablets (1,000 mg total) by mouth 2 (two) times daily (Patient taking differently: Take 1,000 mg by mouth 2 (two) times daily ON HOLD x 24 hours.) 360 tablet 3   montelukast  (SINGULAIR ) 10 mg tablet Take 1 tablet (10 mg total) by mouth once daily 90 tablet 3   neomycin-polymyxin-dexAMETHasone  (MAXITROL) ophthalmic suspension      pantoprazole  (PROTONIX ) 40 MG DR tablet Take 1 tablet (40 mg total) by mouth once daily 90 tablet 3   tamsulosin  (FLOMAX ) 0.4 mg capsule Take 1 capsule (0.4 mg total) by mouth once daily 30 MINUTES AFTER THE SAME MEAL 90 capsule 3   triamcinolone 0.1 % cream Apply topically 2 (two) times daily 30 g 0   blood glucose meter kit as  directed 1 each 0   famotidine  (PEPCID ) 40 MG tablet Take 1 tablet (40 mg total) by mouth at bedtime 90 tablet 3   lancets Use 1 each 3 (three) times daily Use as instructed. 100 each 12   No current facility-administered medications for this visit.      HPI   CLINICAL SUMMARY:  Patient follows up from the emergency room earlier this morning.  Woke up with severe chest pain.  Troponins were negative.  CT of the chest showed possibly a small pericardial effusion but echo cardiography did not show that.  He is having a lot of burping and belching.  Denies any radiculitis type symptoms.  Lives with his daughter.  Having some rectal incontinence at times  ROS: Review of systems is unremarkable for any active cardiac, respiratory, GI, GU, hematologic, neurologic, dermatologic, HEENT, or psychiatric symptoms except as noted above, 10 systems reviewed.  No fevers, chills, or constitutional symptoms.   PHYSICAL EXAM  Vital signs:  BP (!) 150/80   Pulse 62   Wt (!) 130.7 kg (288 lb 3.2 oz)   SpO2 93%   BMI 41.90 kg/m  Body mass index is 41.9 kg/m.   Wt Readings from Last 3 Encounters:  07/16/24 (!) 130.7 kg (288 lb 3.2 oz)  04/10/24 (!) 129.7 kg (286 lb)  02/06/24 (!) 133.4 kg (294 lb)     BP Readings from Last 3 Encounters:  07/16/24 (!) 150/80  04/10/24 (!) 170/80  02/06/24 (!) 150/70    Constitutional:NAD Neck: supple, no thyromegaly, good ROM Respiratory:clear to auscultation, no rales or wheezes Cardiovascular:RRR, no murmur or gallop Abdominal:soft, good BS, NT Ext: no edema, good peripheral pulses Neuro: alert and oriented X 3, grossly nonfocal     ASSESSMENT/PLAN   Atypical chest pain-troponins negative, no sign for acute coronary ischemia, consistent with reflux, no cervical radiculitis.  No pericardial effusion per echo, no clear need for cardiology at this point  He is on Protonix  every morning, add Pepcid  at bedtime Pulmonary nodule-unchanged Rectal  incontinence-no more colchicine .  Could back off on metformin  if he continues to have this  Dispo:   No follow-ups on file.     "

## 2024-07-17 NOTE — Progress Notes (Signed)
 Email ADT - [facility] - [facility]  [facility]) SNF & ED at [facility] 07/17/24 (Friday)  Recent ED discharge Location: [facility]. Discharge date: 07/15/2024.     [facility] - Unable To Enroll Patient  A [facility] Coordinator has successfully reached patient.   Keith Cain, was identified by admission/discharge/transfer alert as a candidate for [facility] care management services.   At this time, Keith Cain declined [facility] care management services.  Please consider discussing the benefits of [facility] with Keith Cain and referring again at a later date.  Keith Cain    [address]; [place], [place] [address] l  [email or web address] l [phone]   For more information on [facility] services, click here.

## 2024-07-23 ENCOUNTER — Other Ambulatory Visit: Payer: Self-pay | Admitting: Physician Assistant

## 2024-07-23 ENCOUNTER — Inpatient Hospital Stay (HOSPITAL_COMMUNITY)
Admission: EM | Admit: 2024-07-23 | Discharge: 2024-07-24 | DRG: 176 | Disposition: A | Discharge status: Home / Self Care | Attending: Family Medicine | Admitting: Family Medicine

## 2024-07-23 ENCOUNTER — Ambulatory Visit
Admission: RE | Admit: 2024-07-23 | Discharge: 2024-07-23 | Disposition: A | Discharge status: Home / Self Care | Source: Ambulatory Visit | Attending: Physician Assistant | Admitting: Physician Assistant

## 2024-07-23 ENCOUNTER — Encounter (HOSPITAL_COMMUNITY): Payer: Self-pay

## 2024-07-23 ENCOUNTER — Other Ambulatory Visit: Payer: Self-pay

## 2024-07-23 ENCOUNTER — Other Ambulatory Visit
Admission: RE | Admit: 2024-07-23 | Discharge: 2024-07-23 | Disposition: A | Discharge status: Home / Self Care | Source: Ambulatory Visit | Attending: Physician Assistant | Admitting: Physician Assistant

## 2024-07-23 DIAGNOSIS — R946 Abnormal results of thyroid function studies: Secondary | ICD-10-CM | POA: Diagnosis present

## 2024-07-23 DIAGNOSIS — I152 Hypertension secondary to endocrine disorders: Secondary | ICD-10-CM | POA: Diagnosis present

## 2024-07-23 DIAGNOSIS — Z7984 Long term (current) use of oral hypoglycemic drugs: Secondary | ICD-10-CM

## 2024-07-23 DIAGNOSIS — I2510 Atherosclerotic heart disease of native coronary artery without angina pectoris: Secondary | ICD-10-CM | POA: Diagnosis present

## 2024-07-23 DIAGNOSIS — I4891 Unspecified atrial fibrillation: Principal | ICD-10-CM | POA: Diagnosis present

## 2024-07-23 DIAGNOSIS — G4733 Obstructive sleep apnea (adult) (pediatric): Secondary | ICD-10-CM | POA: Diagnosis present

## 2024-07-23 DIAGNOSIS — R0602 Shortness of breath: Secondary | ICD-10-CM | POA: Diagnosis present

## 2024-07-23 DIAGNOSIS — I2699 Other pulmonary embolism without acute cor pulmonale: Secondary | ICD-10-CM | POA: Diagnosis not present

## 2024-07-23 DIAGNOSIS — R0789 Other chest pain: Secondary | ICD-10-CM

## 2024-07-23 DIAGNOSIS — N400 Enlarged prostate without lower urinary tract symptoms: Secondary | ICD-10-CM | POA: Diagnosis present

## 2024-07-23 DIAGNOSIS — I10 Essential (primary) hypertension: Secondary | ICD-10-CM | POA: Diagnosis present

## 2024-07-23 DIAGNOSIS — Z8551 Personal history of malignant neoplasm of bladder: Secondary | ICD-10-CM

## 2024-07-23 DIAGNOSIS — J45909 Unspecified asthma, uncomplicated: Secondary | ICD-10-CM | POA: Diagnosis present

## 2024-07-23 DIAGNOSIS — Z7982 Long term (current) use of aspirin: Secondary | ICD-10-CM

## 2024-07-23 DIAGNOSIS — Z87891 Personal history of nicotine dependence: Secondary | ICD-10-CM

## 2024-07-23 DIAGNOSIS — J9811 Atelectasis: Secondary | ICD-10-CM | POA: Diagnosis present

## 2024-07-23 DIAGNOSIS — D649 Anemia, unspecified: Secondary | ICD-10-CM | POA: Diagnosis present

## 2024-07-23 DIAGNOSIS — Z7901 Long term (current) use of anticoagulants: Secondary | ICD-10-CM

## 2024-07-23 DIAGNOSIS — Z79899 Other long term (current) drug therapy: Secondary | ICD-10-CM

## 2024-07-23 DIAGNOSIS — Z66 Do not resuscitate: Secondary | ICD-10-CM | POA: Diagnosis present

## 2024-07-23 DIAGNOSIS — Z85820 Personal history of malignant melanoma of skin: Secondary | ICD-10-CM

## 2024-07-23 DIAGNOSIS — E1169 Type 2 diabetes mellitus with other specified complication: Secondary | ICD-10-CM | POA: Diagnosis present

## 2024-07-23 DIAGNOSIS — E7849 Other hyperlipidemia: Secondary | ICD-10-CM | POA: Diagnosis present

## 2024-07-23 DIAGNOSIS — I3139 Other pericardial effusion (noninflammatory): Secondary | ICD-10-CM | POA: Diagnosis present

## 2024-07-23 DIAGNOSIS — E1151 Type 2 diabetes mellitus with diabetic peripheral angiopathy without gangrene: Secondary | ICD-10-CM | POA: Diagnosis present

## 2024-07-23 DIAGNOSIS — I2694 Multiple subsegmental pulmonary emboli without acute cor pulmonale: Principal | ICD-10-CM | POA: Diagnosis present

## 2024-07-23 DIAGNOSIS — R0609 Other forms of dyspnea: Secondary | ICD-10-CM | POA: Diagnosis present

## 2024-07-23 LAB — BASIC METABOLIC PANEL WITH GFR
Anion gap: 15 (ref 5–15)
BUN: 18 mg/dL (ref 8–23)
CO2: 21 mmol/L — ABNORMAL LOW (ref 22–32)
Calcium: 8.8 mg/dL — ABNORMAL LOW (ref 8.9–10.3)
Chloride: 103 mmol/L (ref 98–111)
Creatinine, Ser: 1.23 mg/dL (ref 0.61–1.24)
GFR, Estimated: 56 mL/min — ABNORMAL LOW
Glucose, Bld: 115 mg/dL — ABNORMAL HIGH (ref 70–99)
Potassium: 3.7 mmol/L (ref 3.5–5.1)
Sodium: 139 mmol/L (ref 135–145)

## 2024-07-23 LAB — CBC
HCT: 35.3 % — ABNORMAL LOW (ref 39.0–52.0)
Hemoglobin: 10.9 g/dL — ABNORMAL LOW (ref 13.0–17.0)
MCH: 27.7 pg (ref 26.0–34.0)
MCHC: 30.9 g/dL (ref 30.0–36.0)
MCV: 89.8 fL (ref 80.0–100.0)
Platelets: 316 10*3/uL (ref 150–400)
RBC: 3.93 MIL/uL — ABNORMAL LOW (ref 4.22–5.81)
RDW: 13.6 % (ref 11.5–15.5)
WBC: 9.7 10*3/uL (ref 4.0–10.5)
nRBC: 0 % (ref 0.0–0.2)

## 2024-07-23 LAB — TROPONIN T, HIGH SENSITIVITY
Troponin T High Sensitivity: 33 ng/L — ABNORMAL HIGH (ref 0–19)
Troponin T High Sensitivity: 31 ng/L — ABNORMAL HIGH (ref 0–19)

## 2024-07-23 LAB — PROTIME-INR
INR: 1.1 (ref 0.8–1.2)
Prothrombin Time: 14.7 s (ref 11.4–15.2)

## 2024-07-23 LAB — D-DIMER, QUANTITATIVE: D-Dimer, Quant: 2.44 ug{FEU}/mL — ABNORMAL HIGH (ref 0.00–0.50)

## 2024-07-23 LAB — GLUCOSE, CAPILLARY: Glucose-Capillary: 169 mg/dL — ABNORMAL HIGH (ref 70–99)

## 2024-07-23 LAB — APTT: aPTT: 40 s — ABNORMAL HIGH (ref 24–36)

## 2024-07-23 LAB — I-STAT CG4 LACTIC ACID, ED: Lactic Acid, Venous: 2 mmol/L (ref 0.5–1.9)

## 2024-07-23 LAB — PRO BRAIN NATRIURETIC PEPTIDE: Pro Brain Natriuretic Peptide: 1769 pg/mL — ABNORMAL HIGH

## 2024-07-23 MED ORDER — FUROSEMIDE 20 MG PO TABS
20.0000 mg | ORAL_TABLET | Freq: Every day | ORAL | Status: DC
  Administered 2024-07-24: 20 mg via ORAL
  Filled 2024-07-23: qty 1

## 2024-07-23 MED ORDER — BISACODYL 5 MG PO TBEC: 5.0000 mg | DELAYED_RELEASE_TABLET | Freq: Every day | ORAL | Status: DC | PRN

## 2024-07-23 MED ORDER — SENNOSIDES-DOCUSATE SODIUM 8.6-50 MG PO TABS: 1.0000 | ORAL_TABLET | Freq: Every evening | ORAL | Status: DC | PRN

## 2024-07-23 MED ORDER — ACETAMINOPHEN 650 MG RE SUPP: 650.0000 mg | Freq: Four times a day (QID) | RECTAL | Status: DC | PRN

## 2024-07-23 MED ORDER — TAMSULOSIN HCL 0.4 MG PO CAPS
0.4000 mg | ORAL_CAPSULE | Freq: Every day | ORAL | Status: DC
  Administered 2024-07-24: 0.4 mg via ORAL
  Filled 2024-07-23: qty 1

## 2024-07-23 MED ORDER — PANTOPRAZOLE SODIUM 40 MG PO TBEC: 40.0000 mg | DELAYED_RELEASE_TABLET | Freq: Every evening | ORAL | Status: DC

## 2024-07-23 MED ORDER — INSULIN ASPART 100 UNIT/ML IJ SOLN
0.0000 [IU] | Freq: Three times a day (TID) | INTRAMUSCULAR | Status: DC
  Administered 2024-07-24 (×2): 2 [IU] via SUBCUTANEOUS
  Filled 2024-07-23 (×2): qty 2

## 2024-07-23 MED ORDER — INSULIN ASPART 100 UNIT/ML IJ SOLN: 0.0000 [IU] | Freq: Every day | INTRAMUSCULAR | Status: DC

## 2024-07-23 MED ORDER — ATORVASTATIN CALCIUM 10 MG PO TABS
10.0000 mg | ORAL_TABLET | Freq: Every day | ORAL | Status: DC
  Administered 2024-07-23: 10 mg via ORAL
  Filled 2024-07-23: qty 1

## 2024-07-23 MED ORDER — MONTELUKAST SODIUM 10 MG PO TABS
10.0000 mg | ORAL_TABLET | Freq: Every day | ORAL | Status: DC
  Administered 2024-07-24: 10 mg via ORAL
  Filled 2024-07-23: qty 1

## 2024-07-23 MED ORDER — ONDANSETRON HCL 4 MG/2ML IJ SOLN: 4.0000 mg | Freq: Four times a day (QID) | INTRAMUSCULAR | Status: DC | PRN

## 2024-07-23 MED ORDER — HEPARIN (PORCINE) 25000 UT/250ML-% IV SOLN
2200.0000 [IU]/h | INTRAVENOUS | Status: DC
  Administered 2024-07-23: 1600 [IU]/h via INTRAVENOUS
  Administered 2024-07-24: 1900 [IU]/h via INTRAVENOUS
  Filled 2024-07-23 (×2): qty 250

## 2024-07-23 MED ORDER — HEPARIN BOLUS VIA INFUSION
5000.0000 [IU] | Freq: Once | INTRAVENOUS | Status: AC
  Administered 2024-07-23: 5000 [IU] via INTRAVENOUS
  Filled 2024-07-23: qty 5000

## 2024-07-23 MED ORDER — ALBUTEROL SULFATE (2.5 MG/3ML) 0.083% IN NEBU
2.5000 mg | INHALATION_SOLUTION | Freq: Four times a day (QID) | RESPIRATORY_TRACT | Status: DC | PRN
  Administered 2024-07-24: 2.5 mg via RESPIRATORY_TRACT
  Filled 2024-07-23: qty 3

## 2024-07-23 MED ORDER — SODIUM CHLORIDE 0.9% FLUSH
3.0000 mL | Freq: Two times a day (BID) | INTRAVENOUS | Status: DC
  Administered 2024-07-23 – 2024-07-24 (×2): 3 mL via INTRAVENOUS

## 2024-07-23 MED ORDER — IOHEXOL 350 MG/ML SOLN
100.0000 mL | Freq: Once | INTRAVENOUS | Status: AC | PRN
  Administered 2024-07-23: 100 mL via INTRAVENOUS

## 2024-07-23 MED ORDER — DILTIAZEM HCL ER COATED BEADS 120 MG PO CP24
120.0000 mg | ORAL_CAPSULE | Freq: Every day | ORAL | Status: DC
  Administered 2024-07-23 – 2024-07-24 (×2): 120 mg via ORAL
  Filled 2024-07-23 (×2): qty 1

## 2024-07-23 MED ORDER — ONDANSETRON HCL 4 MG PO TABS: 4.0000 mg | ORAL_TABLET | Freq: Four times a day (QID) | ORAL | Status: DC | PRN

## 2024-07-23 MED ORDER — ACETAMINOPHEN 325 MG PO TABS: 650.0000 mg | ORAL_TABLET | Freq: Four times a day (QID) | ORAL | Status: DC | PRN

## 2024-07-23 NOTE — ED Triage Notes (Signed)
 Patient said at 2:30 am felt like he could not catch his breath. Saw his PCP today and she called stated he has new onset a fib and multiple pulmonary embolism in his lungs. Takes aspirin .

## 2024-07-23 NOTE — Hospital Course (Addendum)
 89yom PMH including DM type 2, OSA on CPAP presented w/ SOB for 2 weeks. Outpatient CTA showed acute PE, also new dx of afib at PCP, was given diltiazem  and apixaban . Admitted for acute PE.  Consultants None   Procedures/Events

## 2024-07-23 NOTE — ED Notes (Signed)
 US  PIV placed, 20g 1.88 L forearm

## 2024-07-23 NOTE — ED Provider Notes (Addendum)
 " [place] EMERGENCY DEPARTMENT AT [facility] Provider Note   CSN: [id number] Arrival date & time: 07/23/24  1308     Patient presents with: Abnormal CT   Keith Cain is a 89 y.o. male.   HPI     89 year old male comes in with chief complaint of abnormal CT scan.  Patient accompanied by daughter, who provides substantial part of the history.  Patient has history of CAD, diabetes, OSA and hypertension.  He has been feeling short of breath for the last 2 weeks.  He was seen in the ER recently and had CT dissection study and delta troponin which were reassuring.  Subsequently, patient has continued to have some shortness of breath and overnight he woke up, feeling short of breath.  Daughter took patient to PCP, he had elevated D-dimer and CT angiogram subsequently confirmed PE.   Of note, patient also found to have new onset A-fib during this encounter.  Patient has no previous history of A-fib and no known history of CHF.  Patient denies any new leg pain, but has neuropathy.  Pt has no hx of PE, DVT and denies any exogenous hormone (testosterone / estrogen) use, long distance travels or surgery in the past 6 weeks, active cancer, recent immobilization.  Prior to Admission medications  Medication Sig Start Date End Date Taking? Authorizing Provider  acetaminophen  (TYLENOL ) 500 MG tablet Take 1,000 mg by mouth every 8 (eight) hours as needed for moderate pain (pain score 4-6) or headache.   Yes [provider]  acidophilus (RISAQUAD) CAPS capsule Take 1 capsule by mouth daily. 07/23/21  Yes Patel, Sona, MD  albuterol  (PROVENTIL ) (2.5 MG/3ML) 0.083% nebulizer solution Take 2.5 mg by nebulization every 6 (six) hours as needed for wheezing or shortness of breath.   Yes [provider]  aspirin  EC 81 MG tablet Take 81 mg by mouth daily.   Yes [provider]  atorvastatin  (LIPITOR) 10 MG tablet Take 10 mg by mouth at bedtime.    Yes [provider]  Colchicine  0.6 MG CAPS Take 0.6 mg by mouth 2 (two) times daily as needed (gout).   Yes [provider]  doxazosin (CARDURA) 4 MG tablet Take 4 mg by mouth daily.   Yes [provider]  furosemide  (LASIX ) 20 MG tablet Take 20 mg by mouth daily.    Yes [provider]  gabapentin  (NEURONTIN ) 100 MG capsule Take 200 mg by mouth every evening. 03/13/21  Yes [provider]  glimepiride  (AMARYL ) 1 MG tablet Take 0.5 mg by mouth daily with breakfast.   Yes [provider]  losartan  (COZAAR ) 100 MG tablet Take 100 mg by mouth daily.   Yes [provider]  metFORMIN  (GLUCOPHAGE ) 500 MG tablet Take 1,000 mg by mouth 2 (two) times daily with a meal.   Yes [provider]  montelukast  (SINGULAIR ) 10 MG tablet Take 10 mg by mouth daily. 07/14/21  Yes [provider]  pantoprazole  (PROTONIX ) 40 MG tablet Take 1 tablet (40 mg total) by mouth daily. Patient taking differently: Take 40 mg by mouth every evening. 07/23/21  Yes Patel, Sona, MD  tamsulosin  (FLOMAX ) 0.4 MG CAPS capsule Take 0.4 mg by mouth daily.    Yes [provider]  apixaban (ELIQUIS) 2.5 MG TABS tablet Take 2.5 mg by mouth 2 (two) times daily. Patient not taking: Reported on 07/23/2024    [provider]  diltiazem  (CARDIZEM  CD) 120 MG 24 hr capsule Take  120 mg by mouth daily. Patient not taking: Reported on 07/23/2024 07/23/24 07/23/25  [provider]  famotidine  (PEPCID ) 40 MG tablet Take 40 mg by mouth at bedtime. Patient not taking: Reported on 07/23/2024 07/16/24 07/16/25  [provider]    Allergies: Codeine, Dilaudid [hydromorphone hcl], Morphine and codeine, Percocet [oxycodone-acetaminophen ], Versed [midazolam], Vicodin [hydrocodone-acetaminophen ], and Ambien [zolpidem tartrate]    Review of Systems  All other systems reviewed and are negative.   Updated Vital Signs BP (!) 163/88   Pulse 65   Temp 98 F (36.7  C) (Oral)   Resp (!) 24   Ht 5' 9 (1.753 m)   Wt 131.5 kg   SpO2 95%   BMI 42.83 kg/m   Physical Exam Vitals and nursing note reviewed.  Constitutional:      Appearance: He is well-developed.  HENT:     Head: Atraumatic.  Cardiovascular:     Rate and Rhythm: Normal rate.  Pulmonary:     Effort: Pulmonary effort is normal.  Musculoskeletal:        General: No tenderness.     Cervical back: Neck supple.  Skin:    General: Skin is warm.  Neurological:     Mental Status: He is alert and oriented to person, place, and time.     (all labs ordered are listed, but only abnormal results are displayed) Labs Reviewed  PRO BRAIN NATRIURETIC PEPTIDE - Abnormal; Notable for the following components:      Result Value   Pro Brain Natriuretic Peptide 1,769.0 (*)    All other components within normal limits  BASIC METABOLIC PANEL WITH GFR - Abnormal; Notable for the following components:   CO2 21 (*)    Glucose, Bld 115 (*)    Calcium  8.8 (*)    GFR, Estimated 56 (*)    All other components within normal limits  TROPONIN T, HIGH SENSITIVITY - Abnormal; Notable for the following components:   Troponin T High Sensitivity 33 (*)    All other components within normal limits  CBC  PROTIME-INR  APTT  HEPARIN  LEVEL (UNFRACTIONATED)  CBC  I-STAT CG4 LACTIC ACID, ED    EKG: EKG Interpretation Date/Time:  Thursday July 23 2024 14:33:25 EST Ventricular Rate:  96 PR Interval:    QRS Duration:  105 QT Interval:  387 QTC Calculation: 490 R Axis:   104  Text Interpretation: Atrial fibrillation Left posterior fascicular block Borderline prolonged QT interval afib is new Confirmed by Charlyn Sora [phone]) on 07/23/2024 3:16:36 PM  Radiology:     Procedures   Medications Ordered in the ED  heparin  bolus via infusion 5,000 Units (has no administration in time range)  heparin  ADULT infusion 100 units/mL (25000 units/250mL) (has no administration in time range)                                     Medical Decision Making Amount and/or Complexity of Data Reviewed Labs: ordered.  Risk Prescription drug management. Decision regarding hospitalization.   This patient presents to the ED with chief complaint(s) of abnormal CT scan, that indicated that patient had new onset pulmonary embolism and also EKG showing new onset A-fib with pertinent past medical history of hypertension, CAD, OSA. The complaint involves an extensive differential diagnosis and also carries with it a high risk of complications and morbidity.    Additional history obtained from family. I have also reviewed previous  ER visit, previous CT dissection study and CT angiogram chest PE from earlier today.  Effectively, it appears that most likely patient likely had pulmonary embolism, that led to the shortness of breath and now having new onset A-fib again because of the PE.  Other possibility in the differential diagnosis includes CHF, valvular disorder and ACS.  Patient has orthopnea.  The initial management included getting basic labs, troponin, BNP.  Reassessments: Plan is to admit the patient.  Labs are pending at this time.  Heparin  has been ordered.  Patient will need admission for symptomatic A-fib and new onset PE, with some right-sided heart strain.   Final diagnoses:  Atrial fibrillation, unspecified type (HCC)  Acute pulmonary embolism, unspecified pulmonary embolism type, unspecified whether acute cor pulmonale present [facility])    ED Discharge Orders     None          Charlyn Sora, MD 07/23/24 1641    Charlyn Sora, MD 07/23/24 1709  "

## 2024-07-23 NOTE — ED Notes (Signed)
 First poc with patient. Pt axox4. GCS 15. Pt on continuous cardiac, pulse ox and bp monitoring.  No respiratory distress noted.  Requests water to drink.  Denies pain.  Note shortness of breath with long sentences. Currently receiving heparin  1600 units/hr on IV pump.Daughter of patient at bedside.  Updated on plan of care. Verbalize understanding. VS obtained and rercorded

## 2024-07-23 NOTE — ED Provider Triage Note (Signed)
 Emergency Medicine Provider Triage Evaluation Note  Keith Cain , a 89 y.o. male  was evaluated in triage.  Pt complains of abnormal CT unclear shortness of breath.  Patient reports today with concerns of CT showing concerns for pulmonary embolism.  Reportedly was advised to come in for evaluation as this is new onset PE with concerns for possible A-fib.  Patient is not on a blood thinner but does take aspirin .  He endorses exertional dyspnea but no reported hemoptysis.  Denies any leg swelling.  Review of Systems  Positive: As above Negative: As above  Physical Exam  BP (!) 158/102 (BP Location: Right Arm)   Pulse 82   Temp 98 F (36.7 C) (Oral)   Resp 18   Ht 5' 9 (1.753 m)   Wt 131.5 kg   SpO2 98%   BMI 42.83 kg/m  Gen:   Awake, no distress   Resp:  Normal effort, no wheezing, rales, rhonchi MSK:   Moves extremities without difficulty  Other:    Medical Decision Making  Medically screening exam initiated at 1:56 PM.  Appropriate orders placed.  Keith Cain was informed that the remainder of the evaluation will be completed by another provider, this initial triage assessment does not replace that evaluation, and the importance of remaining in the ED until their evaluation is complete.     Cindel Daugherty A, PA-C 07/23/24 1356

## 2024-07-23 NOTE — Progress Notes (Signed)
 PHARMACY - ANTICOAGULATION CONSULT NOTE  Pharmacy Consult for IV heparin  Indication: PE, Afib  Allergies[1]  Patient Measurements: Height: 5' 9 (175.3 cm) Weight: 131.5 kg (290 lb) IBW/kg (Calculated) : 70.7 HEPARIN  DW (KG): 101.3  Vital Signs: Temp: 98.4 F (36.9 C) (01/29 1725) Temp Source: Oral (01/29 1725) BP: 157/88 (01/29 1725) Pulse Rate: 84 (01/29 1725)  Labs: Recent Labs    07/23/24 1613  HGB 10.9*  HCT 35.3*  PLT 316  APTT 40*  LABPROT 14.7  INR 1.1  CREATININE 1.23    Estimated Creatinine Clearance: 53.6 mL/min (by C-G formula based on SCr of 1.23 mg/dL).   Medical History: Past Medical History:  Diagnosis Date   Acute renal failure    Anemia    Arthritis    RA   Asthma    Cancer (HCC)    melanoma   Chronic kidney disease    Complication of anesthesia    HARD TO WAKE UP PATIENT STATES PCP SAID NO IV SEDATION/ HIGH RISK SINCE HARD TO WAKE UP   Diabetes mellitus without complication (HCC)    Edema    Hypertension    Lung nodules    Neuropathy    Oxygen deficiency    WEARS HS   PONV (postoperative nausea and vomiting)    Prostate disorder    Sleep apnea    CPAP    Medications:  (Not in a hospital admission)  Scheduled:  PRN:   Assessment: 55 yoM with PMH CAD, DM2, OSA, HTN, seen at PCP office for ShOB x 2 weeks. Found to have bilat segmental & subsegmental PE w/ RHS present on CTA. Also noted to be in Afib on presentation to [facility]. Pharmacy to dose IV heparin .  Baseline INR, aPTT: not done Prior anticoagulation: ASA 81 mg only Family member reports new Rx written for Eliquis 2.5 bid, but not yet picked up (not seen in fill Hx)  Significant events:  Today, 07/23/2024: CBC: Hgb down 2g from last week, but remains >10; Plt stable WNL SCr c/w baseline (~1.15 last week) No bleeding or infusion issues per nursing  Goal of Therapy: Heparin  level 0.3-0.7 units/ml Monitor platelets by anticoagulation protocol: Yes  Plan: Heparin  5000  units IV bolus x 1 Heparin  1600 units/hr IV infusion Check heparin  level 8 hrs after start Daily CBC, daily heparin  level once stable Monitor for signs of bleeding or thrombosis F/u plans for transition to chronic anticoagulation (expect will transition to Eliquis when stable  Bard Jeans, PharmD, BCPS [phone] 07/23/2024, 5:56 PM    [1]  Allergies Allergen Reactions   Codeine Shortness Of Breath and Rash   Dilaudid [Hydromorphone Hcl] Other (See Comments)    Stopped breathing   Morphine And Codeine Shortness Of Breath and Rash   Percocet [Oxycodone-Acetaminophen ] Shortness Of Breath and Rash   Versed [Midazolam] Anaphylaxis   Vicodin [Hydrocodone-Acetaminophen ] Shortness Of Breath and Rash   Ambien [Zolpidem Tartrate]     Hallucinations

## 2024-07-23 NOTE — ED Notes (Signed)
 Sent lt green, blue top save tubes to lab

## 2024-07-23 NOTE — ED Notes (Signed)
 ED TO INPATIENT HANDOFF REPORT  Name/Age/Gender Keith Cain 89 y.o. male  Code Status    Code Status Orders  (From admission, onward)           Start     Ordered   07/23/24 2046  Do not attempt resuscitation (DNR)- Limited -Do Not Intubate (DNI)  Continuous       Question Answer Comment  If pulseless and not breathing No CPR or chest compressions.   In Pre-Arrest Conditions (Patient Is Breathing and Has A Pulse) Do not intubate. Provide all appropriate non-invasive medical interventions. Avoid ICU transfer unless indicated or required.   Consent: Discussion documented in EHR or advanced directives reviewed      07/23/24 2046           Code Status History     Date Active Date Inactive Code Status Order ID Comments User Context   07/20/2021 2230 07/22/2021 1758 Full Code [id number]  Lanetta Lingo, MD ED      Advance Directive Documentation    Flowsheet Row Most Recent Value  Type of Advance Directive Healthcare Power of Attorney  Pre-existing out of facility DNR order (yellow form or pink MOST form) --  MOST Form in Place? --    Home/SNF/Other Home  Chief Complaint Acute pulmonary embolism, unspecified pulmonary embolism type, unspecified whether acute cor pulmonale present (HCC) [I26.99]  Level of Care/Admitting Diagnosis ED Disposition     ED Disposition  Admit   Condition  --   Comment  Hospital Area: [facility] [[id number]]  Level of Care: Progressive [102]  Admit to Progressive based on following criteria: CARDIOVASCULAR & THORACIC of moderate stability with acute coronary syndrome symptoms/low risk myocardial infarction/hypertensive urgency/arrhythmias/heart failure potentially compromising stability and stable post cardiovascular intervention patients.  May admit patient to [facility] or [facility] if equivalent level of care is available:: No  Diagnosis: Acute pulmonary embolism, unspecified pulmonary embolism type,  unspecified whether acute cor pulmonale present [facility]) [[id number]]  Admitting Physician: TOBIE JORIE SAUNDERS [[id number]]  Attending Physician: TOBIE JORIE SAUNDERS [[id number]]  Certification:: I certify this patient will need inpatient services for at least 2 midnights  Expected Medical Readiness: 07/25/2024          Medical History Past Medical History:  Diagnosis Date   Acute renal failure    Anemia    Arthritis    RA   Asthma    Cancer (HCC)    melanoma   Chronic kidney disease    Complication of anesthesia    HARD TO WAKE UP PATIENT STATES PCP SAID NO IV SEDATION/ HIGH RISK SINCE HARD TO WAKE UP   Diabetes mellitus without complication (HCC)    Edema    Hypertension    Lung nodules    Neuropathy    Oxygen deficiency    WEARS HS   PONV (postoperative nausea and vomiting)    Prostate disorder    Sleep apnea    CPAP    Allergies Allergies[1]  IV Location/Drains/Wounds Patient Lines/Drains/Airways Status     Active Line/Drains/Airways     Name Placement date Placement time Site Days   Peripheral IV 07/23/24 20 G 1.88 Anterior;Left Forearm 07/23/24  1659  Forearm  less than 1   Peripheral IV 07/23/24 20 G Posterior;Right Hand 07/23/24  1748  Hand  less than 1            Labs/Imaging Results for orders placed or performed during the hospital encounter of 07/23/24 (from the past  48 hours)  Pro Brain natriuretic peptide     Status: Abnormal   Collection Time: 07/23/24  3:15 PM  Result Value Ref Range   Pro Brain Natriuretic Peptide 1,769.0 (H) <300.0 pg/mL    Comment: (NOTE) Age Group        Cut-Points    Interpretation  < 50 years     450 pg/mL       NT-proBNP > 450 pg/mL indicates                                ADHF is likely              50 to 75 years  900 pg/mL      NT-proBNP > 900 pg/mL indicates          ADHF is likely  > 75 years      1800 pg/mL     NT-proBNP > 1800 pg/mL indicates          ADHF is likely                           All ages    Results  between       Indeterminate. Further clinical             300 and the cut-   information is needed to determine            point for age group   if ADHF is present.                                                             Elecsys proBNP II/ Elecsys proBNP II STAT           Cut-Point                       Interpretation  300 pg/mL                    NT-proBNP <300pg/mL indicates                             ADHF is not likely  Performed at [facility], [address] [place], [place] [address]   CBC     Status: Abnormal   Collection Time: 07/23/24  4:13 PM  Result Value Ref Range   WBC 9.7 4.0 - 10.5 K/uL   RBC 3.93 (L) 4.22 - 5.81 MIL/uL   Hemoglobin 10.9 (L) 13.0 - 17.0 g/dL   HCT 64.6 (L) 60.9 - 47.9 %   MCV 89.8 80.0 - 100.0 fL   MCH 27.7 26.0 - 34.0 pg   MCHC 30.9 30.0 - 36.0 g/dL   RDW 86.3 88.4 - 84.4 %   Platelets 316 150 - 400 K/uL   nRBC 0.0 0.0 - 0.2 %    Comment: Performed at [facility], [address], [place], [place] [address]  Troponin T, High Sensitivity     Status: Abnormal   Collection Time: 07/23/24  4:13 PM  Result Value Ref Range   Troponin T High Sensitivity 33 (H) 0 - 19  ng/L    Comment: (NOTE) Biotin concentrations > 1000 ng/mL falsely decrease TnT results.  Serial cardiac troponin measurements are suggested.  Refer to the Links section for chest pain algorithms and additional  guidance. Performed at [facility], [address], [place], [place] [address]   Protime-INR     Status: None   Collection Time: 07/23/24  4:13 PM  Result Value Ref Range   Prothrombin Time 14.7 11.4 - 15.2 seconds   INR 1.1 0.8 - 1.2    Comment: (NOTE) INR goal varies based on device and disease states. Performed at [facility], [address], [place], [place] [address]   Basic metabolic panel     Status: Abnormal   Collection Time: 07/23/24  4:13 PM  Result Value Ref Range    Sodium 139 135 - 145 mmol/L   Potassium 3.7 3.5 - 5.1 mmol/L   Chloride 103 98 - 111 mmol/L   CO2 21 (L) 22 - 32 mmol/L   Glucose, Bld 115 (H) 70 - 99 mg/dL    Comment: Glucose reference range applies only to samples taken after fasting for at least 8 hours.   BUN 18 8 - 23 mg/dL   Creatinine, Ser 8.76 0.61 - 1.24 mg/dL   Calcium  8.8 (L) 8.9 - 10.3 mg/dL   GFR, Estimated 56 (L) >60 mL/min    Comment: (NOTE) Calculated using the CKD-EPI Creatinine Equation (2021)    Anion gap 15 5 - 15    Comment: Performed at [facility], [address], [place], [place] [address]  APTT     Status: Abnormal   Collection Time: 07/23/24  4:13 PM  Result Value Ref Range   aPTT 40 (H) 24 - 36 seconds    Comment:        IF BASELINE aPTT IS ELEVATED, SUGGEST PATIENT RISK ASSESSMENT BE USED TO DETERMINE APPROPRIATE ANTICOAGULANT THERAPY. Performed at [facility], [address] [place], [place] [address]   I-Stat CG4 Lactic Acid     Status: Abnormal   Collection Time: 07/23/24  5:20 PM  Result Value Ref Range   Lactic Acid, Venous 2.0 (HH) 0.5 - 1.9 mmol/L  Troponin T, High Sensitivity     Status: Abnormal   Collection Time: 07/23/24  5:41 PM  Result Value Ref Range   Troponin T High Sensitivity 31 (H) 0 - 19 ng/L    Comment: (NOTE) Biotin concentrations > 1000 ng/mL falsely decrease TnT results.  Serial cardiac troponin measurements are suggested.  Refer to the Links section for chest pain algorithms and additional  guidance. Performed at [facility], [address], [place], [place] [address]      Pending Labs Unresulted Labs (From admission, onward)     Start     Ordered   07/24/24 0500  CBC  Daily,   R      07/23/24 1646   07/24/24 0500  Magnesium  Tomorrow morning,   R        07/23/24 2046   07/24/24 0500  TSH  Tomorrow morning,   R        07/23/24 2046   07/24/24 0500  Basic metabolic panel  Tomorrow morning,   R         07/23/24 2046   07/24/24 0200  Heparin  level (unfractionated)  Once-Timed,   URGENT        07/23/24 1646  Vitals/Pain Today's Vitals   07/23/24 1725 07/23/24 1948 07/23/24 1951 07/23/24 2130  BP: (!) 157/88  (!) 174/101 (!) 189/98  Pulse: 84  97 100  Resp: 14  20 19   Temp: 98.4 F (36.9 C)  98.3 F (36.8 C)   TempSrc: Oral  Oral   SpO2: 95%  97% 95%  Weight:      Height:      PainSc:  0-No pain 0-No pain     Isolation Precautions No active isolations  Medications Medications  heparin  ADULT infusion 100 units/mL (25000 units/250mL) (1,600 Units/hr Intravenous New Bag/Given 07/23/24 1749)  sodium chloride  flush (NS) 0.9 % injection 3 mL (has no administration in time range)  acetaminophen  (TYLENOL ) tablet 650 mg (has no administration in time range)    Or  acetaminophen  (TYLENOL ) suppository 650 mg (has no administration in time range)  ondansetron  (ZOFRAN ) tablet 4 mg (has no administration in time range)    Or  ondansetron  (ZOFRAN ) injection 4 mg (has no administration in time range)  senna-docusate (Senokot-S) tablet 1 tablet (has no administration in time range)  bisacodyl  (DULCOLAX) EC tablet 5 mg (has no administration in time range)  atorvastatin  (LIPITOR) tablet 10 mg (has no administration in time range)  diltiazem  (CARDIZEM  CD) 24 hr capsule 120 mg (120 mg Oral Given 07/23/24 2146)  furosemide  (LASIX ) tablet 20 mg (has no administration in time range)  montelukast  (SINGULAIR ) tablet 10 mg (has no administration in time range)  albuterol  (PROVENTIL ) (2.5 MG/3ML) 0.083% nebulizer solution 2.5 mg (has no administration in time range)  pantoprazole  (PROTONIX ) EC tablet 40 mg (has no administration in time range)  tamsulosin  (FLOMAX ) capsule 0.4 mg (has no administration in time range)  insulin  aspart (novoLOG ) injection 0-9 Units (has no administration in time range)  insulin  aspart (novoLOG ) injection 0-5 Units (has no administration in time range)   heparin  bolus via infusion 5,000 Units (5,000 Units Intravenous Bolus from Bag 07/23/24 1750)    Mobility walks with person assist     [1]  Allergies Allergen Reactions   Codeine Shortness Of Breath and Rash   Dilaudid [Hydromorphone Hcl] Other (See Comments)    Stopped breathing   Morphine And Codeine Shortness Of Breath and Rash   Percocet [Oxycodone-Acetaminophen ] Shortness Of Breath and Rash   Versed [Midazolam] Anaphylaxis   Vicodin [Hydrocodone-Acetaminophen ] Shortness Of Breath and Rash   Ambien [Zolpidem Tartrate]     Hallucinations

## 2024-07-23 NOTE — H&P (Signed)
 " History and Physical    Keith Cain FMW:[id number] DOB: 10-13-1931 DOA: 07/23/2024  PCP: Cleotilde Oneil FALCON, MD  Patient coming from: Home  I have personally briefly reviewed patient's old medical records in [facility]  Chief Complaint: Shortness of breath  HPI: Keith Cain is a 89 y.o. male with medical history significant for T2DM, HTN, HLD, asthma, bladder cancer s/p TURBT, BPH, OSA on CPAP who presented to the ED for evaluation of shortness of breath.  Patient states that he has been experiencing exertional shortness of breath for about 2 weeks now.  He was seen in the ED on 1/21 with complaint of chest pain.  Troponin was minimally elevated and flat on repeat.  EKG showed sinus rhythm with no significant ischemic changes.  CTA chest/abdomen/pelvis dissection study was obtained.  There was no evidence of dissection, aneurysm, pulmonary embolus.  Small pericardial effusion was noted.  Severe narrowing at origin of inferior mesenteric artery secondary to calcified plaque was also seen.  Patient was stable for discharge to home.  This morning around 2:30 AM he felt like he could not catch his breath.  He was seen by his primary care team as same-day visit.  A D-dimer was obtained and elevated at 2.44.  He was noted to have new A-fib with controlled rate.  He was given prescriptions for diltiazem  and Eliquis and an outpatient CTA chest was ordered and performed this afternoon.  Patient was notified of results that both CTA chest showed acute multifocal PE and advised to come to the ED for further evaluation.  CTA chest IMPRESSION: 1. Acute pulmonary emboli involving segmental and subsegmental branches in the right upper, right middle, right lower, and left lower lobe pulmonary arteries, and lingular branches. Right ventricular to left ventricular ratio is 1.1 compatible with at least submassive pulmonary embolus. 2. Small to moderate left and small right pleural effusions with passive  atelectasis. 3. Moderate cardiomegaly with a small pericardial effusion. 4. Mildly enlarged right eccentric subcarinal lymph node measuring 1.3 cm in short axis. 5. Thoracic aortic, coronary artery, and branch vessel atherosclerotic calcifications. 6. 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 7. Degenerative sternoclavicular arthropathy bilaterally. 8. Thoracic spondylosis.  Currently patient states that he is not short of breath when resting but does become dyspneic with minimal activity.  He denies chest pain, fevers, chills, diaphoresis, abdominal pain, dysuria, diarrhea, obvious bleeding.  He has chronic swelling to his lower extremities for which he takes Lasix  daily.  No prior history of A-fib or congestive heart failure.  ED Course  Labs/Imaging on admission: I have personally reviewed following labs and imaging studies.  Initial vitals showed BP 158/102, pulse 82, RR 18, temp 98.0 F, SpO2 98% on room air.  Labs showed WBC 9.7, hemoglobin 10.9, platelets 316, proBNP 1769, troponin T 33 > 31, sodium 139, potassium 3.7, bicarb 21, BUN 18, creatinine 1.23, serum glucose 115, lactic acid 2.0.  Patient was started on IV heparin .  The hospitalist service was consulted for admission.  Review of Systems: All systems reviewed and are negative except as documented in history of present illness above.   Past Medical History:  Diagnosis Date   Acute renal failure    Anemia    Arthritis    RA   Asthma    Cancer (HCC)    melanoma   Chronic kidney disease    Complication of anesthesia    HARD TO WAKE UP PATIENT STATES PCP SAID NO  IV SEDATION/ HIGH RISK SINCE HARD TO WAKE UP   Diabetes mellitus without complication (HCC)    Edema    Hypertension    Lung nodules    Neuropathy    Oxygen deficiency    WEARS HS   PONV (postoperative nausea and vomiting)    Prostate disorder    Sleep apnea    CPAP    Past Surgical History:  Procedure Laterality Date   APPENDECTOMY      BACK SURGERY     lumbar   CATARACT EXTRACTION W/PHACO Right 08/30/2016   Procedure: CATARACT EXTRACTION PHACO AND INTRAOCULAR LENS PLACEMENT (IOC);  Surgeon: Adine Oneil Novak, MD;  Location: [facility] ORS;  Service: Ophthalmology;  Laterality: Right;  Lot# [id number] US : 00:55.5 AP%: 11.0 CDE: 6.08   CATARACT EXTRACTION W/PHACO Left 09/27/2016   Procedure: CATARACT EXTRACTION PHACO AND INTRAOCULAR LENS PLACEMENT (IOC);  Surgeon: Adine Oneil Novak, MD;  Location: [facility] ORS;  Service: Ophthalmology;  Laterality: Left;  US  54.9 AP% 13.4 CDE 7.36 Fluid pack lot # [id number]   CYSTOSCOPY WITH BIOPSY N/A 10/14/2018   Procedure: CYSTOSCOPY WITH BLADDER BIOPSY;  Surgeon: Twylla Glendia BROCKS, MD;  Location: [facility] ORS;  Service: Urology;  Laterality: N/A;   FOREARM SURGERY     RIGHT   JOINT REPLACEMENT Bilateral    TH X 3   RCR Right    rotator cuff repair   TRANSURETHRAL RESECTION OF BLADDER TUMOR N/A 08/20/2018   Procedure: TRANSURETHRAL RESECTION OF BLADDER TUMOR (TURBT);  Surgeon: Twylla Glendia BROCKS, MD;  Location: [facility] ORS;  Service: Urology;  Laterality: N/A;   TRANSURETHRAL RESECTION OF BLADDER TUMOR N/A 10/14/2018   Procedure: TRANSURETHRAL RESECTION OF BLADDER TUMOR (TURBT);  Surgeon: Twylla Glendia BROCKS, MD;  Location: [facility] ORS;  Service: Urology;  Laterality: N/A;    Social History: Social History[1]  Allergies[2]  History reviewed. No pertinent family history.   Prior to Admission medications  Medication Sig Start Date End Date Taking? Authorizing Provider  acetaminophen  (TYLENOL ) 500 MG tablet Take 1,000 mg by mouth every 8 (eight) hours as needed for moderate pain (pain score 4-6) or headache.   Yes [provider]  acidophilus (RISAQUAD) CAPS capsule Take 1 capsule by mouth daily. 07/23/21  Yes Zaryiah Barz, Sona, MD  albuterol  (PROVENTIL ) (2.5 MG/3ML) 0.083% nebulizer solution Take 2.5 mg by nebulization every 6 (six) hours as needed for wheezing or shortness of breath.   Yes [provider]  aspirin  EC 81 MG tablet Take 81 mg by mouth daily.   Yes [provider]  atorvastatin  (LIPITOR) 10 MG tablet Take 10 mg by mouth at bedtime.    Yes [provider]  Colchicine  0.6 MG CAPS Take 0.6 mg by mouth 2 (two) times daily as needed (gout).   Yes [provider]  doxazosin (CARDURA) 4 MG tablet Take 4 mg by mouth daily.   Yes [provider]  furosemide  (LASIX ) 20 MG tablet Take 20 mg by mouth daily.    Yes [provider]  gabapentin  (NEURONTIN ) 100 MG capsule Take 200 mg by mouth every evening. 03/13/21  Yes [provider]  glimepiride  (AMARYL ) 1 MG tablet Take 0.5 mg by mouth daily with breakfast.   Yes [provider]  losartan  (COZAAR ) 100 MG tablet Take 100 mg by mouth daily.   Yes [provider]  metFORMIN  (GLUCOPHAGE ) 500 MG tablet Take 1,000 mg by mouth 2 (two) times daily with a meal.   Yes [provider]  montelukast  (SINGULAIR )  10 MG tablet Take 10 mg by mouth daily. 07/14/21  Yes [provider]  pantoprazole  (PROTONIX ) 40 MG tablet Take 1 tablet (40 mg total) by mouth daily. Patient taking differently: Take 40 mg by mouth every evening. 07/23/21  Yes Nalda Shackleford, Sona, MD  tamsulosin  (FLOMAX ) 0.4 MG CAPS capsule Take 0.4 mg by mouth daily.    Yes [provider]  apixaban (ELIQUIS) 2.5 MG TABS tablet Take 2.5 mg by mouth 2 (two) times daily. Patient not taking: Reported on 07/23/2024    [provider]  diltiazem  (CARDIZEM  CD) 120 MG 24 hr capsule Take 120 mg by mouth daily. Patient not taking: Reported on 07/23/2024 07/23/24 07/23/25  [provider]  famotidine  (PEPCID ) 40 MG tablet Take 40 mg by mouth at bedtime. Patient not taking: Reported on 07/23/2024 07/16/24 07/16/25  [provider]    Physical Exam: Vitals:   07/23/24 1615 07/23/24 1725 07/23/24 1951 07/23/24 2130  BP: (!) 163/88 (!) 157/88 (!) 174/101 (!) 189/98  Pulse: 65 84 97  100  Resp: (!) 24 14 20 19   Temp:  98.4 F (36.9 C) 98.3 F (36.8 C)   TempSrc:  Oral Oral   SpO2: 95% 95% 97% 95%  Weight:      Height:       Constitutional: Elderly man resting in bed with head elevated, NAD, calm, comfortable Eyes: EOMI, lids and conjunctivae normal ENMT: Mucous membranes are moist. Posterior pharynx clear of any exudate or lesions.Normal dentition.  Neck: normal, supple, no masses. Respiratory: clear to auscultation bilaterally, no wheezing, no crackles. Normal respiratory effort. No accessory muscle use.  Cardiovascular: Irregularly irregular, no murmurs / rubs / gallops. No extremity edema. 2+ pedal pulses. Abdomen: no tenderness, no masses palpated. Musculoskeletal: no clubbing / cyanosis. No joint deformity upper and lower extremities. Good ROM, no contractures. Normal muscle tone.  Skin: no rashes, lesions, ulcers. No induration Neurologic: Sensation intact. Strength 5/5 in all 4.  Psychiatric: Normal judgment and insight. Alert and oriented x 4. Normal mood.   EKG: Personally reviewed.  Atrial fibrillation, rate 96, LPFB, QTc 490.  A-fib is new when compared to prior.  Assessment/Plan Principal Problem:   Acute pulmonary embolism, unspecified pulmonary embolism type, unspecified whether acute cor pulmonale present (HCC) Active Problems:   Atrial fibrillation (HCC)   BPH (benign prostatic hyperplasia)   Hyperlipidemia associated with type 2 diabetes mellitus (HCC)   OSA on CPAP   Type 2 diabetes mellitus with peripheral angiopathy (HCC)   Hypertension associated with diabetes (HCC)   Keith Cain is a 89 y.o. male with medical history significant for T2DM, HTN, HLD, asthma, bladder cancer s/p TURBT, BPH, OSA on CPAP who is admitted with acute PE and new atrial fibrillation.  Assessment and Plan: Acute pulmonary emboli: Outpatient CTA chest showed acute PE involving segmental and subsegmental branches in right upper/middle/lower and left lower lobe  pulmonary arteries and lingular branches.  CT suggestive of at least submassive PE.  Small to moderate left and small right pleural effusions and small pericardial effusion also noted.  Patient currently saturating well on room air and hemodynamically stable. - Continue IV heparin  - Obtain echocardiogram - Obtain lower extremity DVT studies - Supplemental oxygen as needed  Atrial fibrillation, new diagnosis: Suspect triggered by acute PE.  Rate is currently controlled.  CHA2DS2-VASc score is at least 4. - Started on IV heparin  as above - Start on oral diltiazem  120 mg daily and titrate as needed - Obtain echocardiogram  Hypertension: Started on oral diltiazem  120 mg daily as above.  If BP remains elevated can add back home losartan .  Type 2 diabetes: Holding metformin .  Placed on SSI.  Hemoglobin A1c 7.8% on 04/03/2024.  Asthma: Continue Singulair  and albuterol  as needed.  Hyperlipidemia: Continue atorvastatin .  Normocytic anemia: Mild, hemoglobin 10.9.  Continue to monitor.  BPH: Continue tamsulosin .  Patient also noted to be prescribed doxazosin.  OSA: Continue CPAP nightly.   DVT prophylaxis: IV heparin  Code Status:   Code Status: Limited: Do not attempt resuscitation (DNR) -DNR-LIMITED -Do Not Intubate/DNI   confirmed with patient on admission Family Communication: Daughter at bedside Disposition Plan: From home, dispo pending clinical progress Consults called: None Severity of Illness: The appropriate patient status for this patient is INPATIENT. Inpatient status is judged to be reasonable and necessary in order to provide the required intensity of service to ensure the patient's safety. The patient's presenting symptoms, physical exam findings, and initial radiographic and laboratory data in the context of their chronic comorbidities is felt to place them at high risk for further clinical deterioration. Furthermore, it is not anticipated that the patient will be medically  stable for discharge from the hospital within 2 midnights of admission.   * I certify that at the point of admission it is my clinical judgment that the patient will require inpatient hospital care spanning beyond 2 midnights from the point of admission due to high intensity of service, high risk for further deterioration and high frequency of surveillance required.DEWAINE Jorie Blanch MD [facility]  If 7PM-7AM, please contact night-coverage [email or web address]  07/23/2024, 9:49 PM      [1]  Social History Tobacco Use   Smoking status: Former    Current packs/day: 0.00    Types: Cigarettes    Quit date: 1960    Years since quitting: 66.1   Smokeless tobacco: Former  Building Services Engineer status: Never Used  Substance Use Topics   Alcohol use: No   Drug use: Never  [2]  Allergies Allergen Reactions   Codeine Shortness Of Breath and Rash   Dilaudid [Hydromorphone Hcl] Other (See Comments)    Stopped breathing   Morphine And Codeine Shortness Of Breath and Rash   Percocet [Oxycodone-Acetaminophen ] Shortness Of Breath and Rash   Versed [Midazolam] Anaphylaxis   Vicodin [Hydrocodone-Acetaminophen ] Shortness Of Breath and Rash   Ambien [Zolpidem Tartrate]     Hallucinations    "

## 2024-07-24 ENCOUNTER — Inpatient Hospital Stay (HOSPITAL_COMMUNITY)

## 2024-07-24 ENCOUNTER — Telehealth (HOSPITAL_COMMUNITY): Payer: Self-pay

## 2024-07-24 ENCOUNTER — Other Ambulatory Visit (HOSPITAL_COMMUNITY): Payer: Self-pay

## 2024-07-24 DIAGNOSIS — I4891 Unspecified atrial fibrillation: Secondary | ICD-10-CM

## 2024-07-24 DIAGNOSIS — Z86711 Personal history of pulmonary embolism: Secondary | ICD-10-CM

## 2024-07-24 DIAGNOSIS — I2602 Saddle embolus of pulmonary artery with acute cor pulmonale: Secondary | ICD-10-CM

## 2024-07-24 DIAGNOSIS — E1151 Type 2 diabetes mellitus with diabetic peripheral angiopathy without gangrene: Secondary | ICD-10-CM

## 2024-07-24 DIAGNOSIS — I2699 Other pulmonary embolism without acute cor pulmonale: Secondary | ICD-10-CM | POA: Diagnosis not present

## 2024-07-24 LAB — ECHOCARDIOGRAM COMPLETE
Area-P 1/2: 2.9 cm2
Calc EF: 65.9 %
Height: 69 in
S' Lateral: 4 cm
Single Plane A2C EF: 66.8 %
Single Plane A4C EF: 62.5 %
Weight: 4546.77 [oz_av]

## 2024-07-24 LAB — BASIC METABOLIC PANEL WITH GFR
Anion gap: 12 (ref 5–15)
BUN: 18 mg/dL (ref 8–23)
CO2: 23 mmol/L (ref 22–32)
Calcium: 8.8 mg/dL — ABNORMAL LOW (ref 8.9–10.3)
Chloride: 103 mmol/L (ref 98–111)
Creatinine, Ser: 1.21 mg/dL (ref 0.61–1.24)
GFR, Estimated: 57 mL/min — ABNORMAL LOW
Glucose, Bld: 179 mg/dL — ABNORMAL HIGH (ref 70–99)
Potassium: 3.7 mmol/L (ref 3.5–5.1)
Sodium: 138 mmol/L (ref 135–145)

## 2024-07-24 LAB — CBC
HCT: 34.1 % — ABNORMAL LOW (ref 39.0–52.0)
Hemoglobin: 10.5 g/dL — ABNORMAL LOW (ref 13.0–17.0)
MCH: 27.6 pg (ref 26.0–34.0)
MCHC: 30.8 g/dL (ref 30.0–36.0)
MCV: 89.5 fL (ref 80.0–100.0)
Platelets: 322 10*3/uL (ref 150–400)
RBC: 3.81 MIL/uL — ABNORMAL LOW (ref 4.22–5.81)
RDW: 13.7 % (ref 11.5–15.5)
WBC: 7.6 10*3/uL (ref 4.0–10.5)
nRBC: 0 % (ref 0.0–0.2)

## 2024-07-24 LAB — TSH: TSH: 6.68 u[IU]/mL — ABNORMAL HIGH (ref 0.350–4.500)

## 2024-07-24 LAB — HEPARIN LEVEL (UNFRACTIONATED)
Heparin Unfractionated: 0.15 [IU]/mL — ABNORMAL LOW (ref 0.30–0.70)
Heparin Unfractionated: 0.24 [IU]/mL — ABNORMAL LOW (ref 0.30–0.70)

## 2024-07-24 LAB — MAGNESIUM: Magnesium: 1.5 mg/dL — ABNORMAL LOW (ref 1.7–2.4)

## 2024-07-24 LAB — GLUCOSE, CAPILLARY
Glucose-Capillary: 168 mg/dL — ABNORMAL HIGH (ref 70–99)
Glucose-Capillary: 173 mg/dL — ABNORMAL HIGH (ref 70–99)

## 2024-07-24 MED ORDER — HEPARIN BOLUS VIA INFUSION
3000.0000 [IU] | Freq: Once | INTRAVENOUS | Status: AC
  Administered 2024-07-24: 3000 [IU] via INTRAVENOUS
  Filled 2024-07-24: qty 3000

## 2024-07-24 MED ORDER — MAGNESIUM SULFATE 2 GM/50ML IV SOLN
2.0000 g | Freq: Once | INTRAVENOUS | Status: AC
  Administered 2024-07-24: 2 g via INTRAVENOUS
  Filled 2024-07-24: qty 50

## 2024-07-24 MED ORDER — APIXABAN 5 MG PO TABS: 5.0000 mg | ORAL_TABLET | Freq: Two times a day (BID) | ORAL | Status: DC

## 2024-07-24 MED ORDER — DILTIAZEM HCL ER COATED BEADS 120 MG PO CP24
120.0000 mg | ORAL_CAPSULE | Freq: Every day | ORAL | 0 refills | Status: AC
  Filled 2024-07-24: qty 30, 30d supply, fill #0

## 2024-07-24 MED ORDER — APIXABAN (ELIQUIS) VTE STARTER PACK (10MG AND 5MG)
ORAL_TABLET | ORAL | 0 refills | Status: AC
  Filled 2024-07-24: qty 74, 30d supply, fill #0

## 2024-07-24 MED ORDER — APIXABAN 5 MG PO TABS
10.0000 mg | ORAL_TABLET | Freq: Two times a day (BID) | ORAL | Status: DC
  Administered 2024-07-24: 10 mg via ORAL
  Filled 2024-07-24: qty 2

## 2024-07-24 NOTE — Progress Notes (Signed)
" °   07/24/24 0030  BiPAP/CPAP/SIPAP  $ Non-Invasive Home Ventilator  Initial  $ Face Mask Medium Yes  BiPAP/CPAP/SIPAP Pt Type Adult  BiPAP/CPAP/SIPAP Resmed  Mask Type Full face mask  Dentures removed? Not applicable  Mask Size Medium  FiO2 (%) 21 %  Patient Home Machine No  Patient Home Mask No  Patient Home Tubing No  Auto Titrate Yes  Minimum cmH2O 4 cmH2O  Maximum cmH2O 20 cmH2O  CPAP/SIPAP surface wiped down Yes  Device Plugged into RED Power Outlet Yes    "

## 2024-07-24 NOTE — Progress Notes (Signed)
" ° °  Brief Progress Note   _____________________________________________________________________________________________________________  Patient Name: Keith Cain Patient DOB: [birth year] Date: 07/24/2024     Data: Recv'd messge from Dr Jadine about expediting ECHO completetion and DVT study results for pending DC.    Action: Chart reviewed. Prelim results for DVT study complete. ECHO was unable to be completed earlier today d/t patient care needs.   Sent message to [place], RDCS to see when patient ECHO could be completed. Ellouise able to start ECHO within next 30 mins but patient needed to be back in bed. Sent message to Raynard RN and Adrianna NT to see if patient could be back in bed before ECHO. Per Adrianna NT, patient already back in bed.    Response: Information relayed back to Dr Jadine in secure chat thread. Will follow up with attempting to expedite ECHO read once completed.   ADDENDUM 1430: ECHO completed and resulted. Team updated in secure chat thread.  _____________________________________________________________________________________________________________  [facility] RN Expeditor Tyquavious Gamel Please contact us  directly via secure chat (search for [facility]) or by calling us  at [phone] [facility]).  "

## 2024-07-24 NOTE — Progress Notes (Signed)
 PHARMACY - ANTICOAGULATION CONSULT NOTE  Pharmacy Consult for IV heparin  Indication: PE, Afib  Allergies[1]  Patient Measurements: Height: 5' 9 (175.3 cm) Weight: 131.5 kg (290 lb) IBW/kg (Calculated) : 70.7 HEPARIN  DW (KG): 101.3  Vital Signs: Temp: 97.5 F (36.4 C) (01/30 0144) Temp Source: Oral (01/30 0144) BP: 115/81 (01/30 0144) Pulse Rate: 89 (01/30 0144)  Labs: Recent Labs    07/23/24 1613 07/24/24 0131  HGB 10.9* 10.5*  HCT 35.3* 34.1*  PLT 316 322  APTT 40*  --   LABPROT 14.7  --   INR 1.1  --   HEPARINUNFRC  --  0.15*  CREATININE 1.23 1.21    Estimated Creatinine Clearance: 54.5 mL/min (by C-G formula based on SCr of 1.21 mg/dL).   Medical History: Past Medical History:  Diagnosis Date   Acute renal failure    Anemia    Arthritis    RA   Asthma    Cancer (HCC)    melanoma   Chronic kidney disease    Complication of anesthesia    HARD TO WAKE UP PATIENT STATES PCP SAID NO IV SEDATION/ HIGH RISK SINCE HARD TO WAKE UP   Diabetes mellitus without complication (HCC)    Edema    Hypertension    Lung nodules    Neuropathy    Oxygen deficiency    WEARS HS   PONV (postoperative nausea and vomiting)    Prostate disorder    Sleep apnea    CPAP    Medications:  Medications Prior to Admission  Medication Sig Dispense Refill Last Dose/Taking   acetaminophen  (TYLENOL ) 500 MG tablet Take 1,000 mg by mouth every 8 (eight) hours as needed for moderate pain (pain score 4-6) or headache.   07/23/2024   acidophilus (RISAQUAD) CAPS capsule Take 1 capsule by mouth daily. 10 capsule 0 07/23/2024   albuterol  (PROVENTIL ) (2.5 MG/3ML) 0.083% nebulizer solution Take 2.5 mg by nebulization every 6 (six) hours as needed for wheezing or shortness of breath.   07/23/2024   aspirin  EC 81 MG tablet Take 81 mg by mouth daily.   07/23/2024   atorvastatin  (LIPITOR) 10 MG tablet Take 10 mg by mouth at bedtime.    07/22/2024   Colchicine  0.6 MG CAPS Take 0.6 mg by mouth 2 (two)  times daily as needed (gout).   Past Month   doxazosin (CARDURA) 4 MG tablet Take 4 mg by mouth daily.   07/22/2024   furosemide  (LASIX ) 20 MG tablet Take 20 mg by mouth daily.    07/23/2024   gabapentin  (NEURONTIN ) 100 MG capsule Take 200 mg by mouth every evening.   07/22/2024   glimepiride  (AMARYL ) 1 MG tablet Take 0.5 mg by mouth daily with breakfast.   07/23/2024   losartan  (COZAAR ) 100 MG tablet Take 100 mg by mouth daily.   07/23/2024   metFORMIN  (GLUCOPHAGE ) 500 MG tablet Take 1,000 mg by mouth 2 (two) times daily with a meal.   07/23/2024   montelukast  (SINGULAIR ) 10 MG tablet Take 10 mg by mouth daily.   07/23/2024   pantoprazole  (PROTONIX ) 40 MG tablet Take 1 tablet (40 mg total) by mouth daily. (Patient taking differently: Take 40 mg by mouth every evening.) 30 tablet 0 07/23/2024   tamsulosin  (FLOMAX ) 0.4 MG CAPS capsule Take 0.4 mg by mouth daily.    07/23/2024   apixaban  (ELIQUIS ) 2.5 MG TABS tablet Take 2.5 mg by mouth 2 (two) times daily. (Patient not taking: Reported on 07/23/2024)   Not Taking  diltiazem  (CARDIZEM  CD) 120 MG 24 hr capsule Take 120 mg by mouth daily. (Patient not taking: Reported on 07/23/2024)   Not Taking   famotidine  (PEPCID ) 40 MG tablet Take 40 mg by mouth at bedtime. (Patient not taking: Reported on 07/23/2024)   Not Taking   Scheduled:   atorvastatin   10 mg Oral QHS   diltiazem   120 mg Oral Daily   furosemide   20 mg Oral Daily   heparin   3,000 Units Intravenous Once   insulin  aspart  0-5 Units Subcutaneous QHS   insulin  aspart  0-9 Units Subcutaneous TID WC   montelukast   10 mg Oral Daily   pantoprazole   40 mg Oral QPM   sodium chloride  flush  3 mL Intravenous Q12H   tamsulosin   0.4 mg Oral Daily   PRN:   Assessment: 89 yoM with PMH CAD, DM2, OSA, HTN, seen at PCP office for ShOB x 2 weeks. Found to have bilat segmental & subsegmental PE w/ RHS present on CTA. Also noted to be in Afib on presentation to [facility] Pharmacy to dose IV heparin .  Baseline INR,  aPTT: not done Prior anticoagulation: ASA 81 mg only Family member reports new Rx written for Eliquis  2.5 bid, but not yet picked up (not seen in fill Hx)  Significant events:  Today, 07/24/2024: Heparin  level = 0.15 (subtherapeutic) with heparin  gtt @ 1600 units/hr CBC: Hgb 10.5; Plt WNL No bleeding or infusion issues per nursing  Goal of Therapy: Heparin  level 0.3-0.7 units/ml Monitor platelets by anticoagulation protocol: Yes  Plan: Heparin  3000 units IV bolus x 1 Increase Heparin  gtt to 1900 units/hr  Check heparin  level 8 hrs after rate increase Daily CBC, daily heparin  level once stable Monitor for signs of bleeding or thrombosis F/u plans for transition to chronic anticoagulation (expect will transition to Eliquis  when stable  Arvin Gauss, PharmD 07/24/2024, 2:31 AM     [1]  Allergies Allergen Reactions   Codeine Shortness Of Breath and Rash   Dilaudid [Hydromorphone Hcl] Other (See Comments)    Stopped breathing   Morphine And Codeine Shortness Of Breath and Rash   Percocet [Oxycodone-Acetaminophen ] Shortness Of Breath and Rash   Versed [Midazolam] Anaphylaxis   Vicodin [Hydrocodone-Acetaminophen ] Shortness Of Breath and Rash   Ambien [Zolpidem Tartrate]     Hallucinations

## 2024-07-24 NOTE — Plan of Care (Signed)
  Problem: Education: Goal: Ability to describe self-care measures that may prevent or decrease complications (Diabetes Survival Skills Education) will improve Outcome: Adequate for Discharge Goal: Individualized Educational Video(s) Outcome: Adequate for Discharge   Problem: Coping: Goal: Ability to adjust to condition or change in health will improve Outcome: Adequate for Discharge   Problem: Fluid Volume: Goal: Ability to maintain a balanced intake and output will improve Outcome: Adequate for Discharge   Problem: Health Behavior/Discharge Planning: Goal: Ability to identify and utilize available resources and services will improve Outcome: Adequate for Discharge Goal: Ability to manage health-related needs will improve Outcome: Adequate for Discharge   Problem: Metabolic: Goal: Ability to maintain appropriate glucose levels will improve Outcome: Adequate for Discharge   Problem: Nutritional: Goal: Maintenance of adequate nutrition will improve Outcome: Adequate for Discharge Goal: Progress toward achieving an optimal weight will improve Outcome: Adequate for Discharge   Problem: Skin Integrity: Goal: Risk for impaired skin integrity will decrease Outcome: Adequate for Discharge   Problem: Tissue Perfusion: Goal: Adequacy of tissue perfusion will improve Outcome: Adequate for Discharge   Problem: Education: Goal: Knowledge of General Education information will improve Description: Including pain rating scale, medication(s)/side effects and non-pharmacologic comfort measures Outcome: Adequate for Discharge   Problem: Health Behavior/Discharge Planning: Goal: Ability to manage health-related needs will improve Outcome: Adequate for Discharge   Problem: Clinical Measurements: Goal: Ability to maintain clinical measurements within normal limits will improve Outcome: Adequate for Discharge Goal: Will remain free from infection Outcome: Adequate for Discharge Goal:  Diagnostic test results will improve Outcome: Adequate for Discharge Goal: Respiratory complications will improve Outcome: Adequate for Discharge Goal: Cardiovascular complication will be avoided Outcome: Adequate for Discharge   Problem: Activity: Goal: Risk for activity intolerance will decrease Outcome: Adequate for Discharge   Problem: Nutrition: Goal: Adequate nutrition will be maintained Outcome: Adequate for Discharge   Problem: Coping: Goal: Level of anxiety will decrease Outcome: Adequate for Discharge   Problem: Elimination: Goal: Will not experience complications related to bowel motility Outcome: Adequate for Discharge Goal: Will not experience complications related to urinary retention Outcome: Adequate for Discharge   Problem: Pain Managment: Goal: General experience of comfort will improve and/or be controlled Outcome: Adequate for Discharge   Problem: Safety: Goal: Ability to remain free from injury will improve Outcome: Adequate for Discharge   Problem: Skin Integrity: Goal: Risk for impaired skin integrity will decrease Outcome: Adequate for Discharge   Problem: Education: Goal: Knowledge of disease or condition will improve Outcome: Adequate for Discharge Goal: Understanding of medication regimen will improve Outcome: Adequate for Discharge Goal: Individualized Educational Video(s) Outcome: Adequate for Discharge   Problem: Activity: Goal: Ability to tolerate increased activity will improve Outcome: Adequate for Discharge   Problem: Cardiac: Goal: Ability to achieve and maintain adequate cardiopulmonary perfusion will improve Outcome: Adequate for Discharge   Problem: Health Behavior/Discharge Planning: Goal: Ability to safely manage health-related needs after discharge will improve Outcome: Adequate for Discharge

## 2024-07-24 NOTE — Progress Notes (Signed)
 Venous duplex lower ext  has been completed. Refer to [facility] under chart review to view preliminary results.   07/24/2024  10:25 AM Ahrianna Siglin, Ricka BIRCH

## 2024-07-24 NOTE — Discharge Summary (Addendum)
 " Physician Discharge Summary   Patient: Keith Cain MRN: [id number] DOB: 1932-05-01  Admit date:     07/23/2024  Discharge date: 07/24/24  Discharge Physician: Toribio Door   PCP: Cleotilde Oneil FALCON, MD   Recommendations at discharge:   New PE New afib Elevated TSH Moderate pericardial effusion  Discharge Diagnoses: Principal Problem:   Acute pulmonary embolism, unspecified pulmonary embolism type, unspecified whether acute cor pulmonale present [facility]) Active Problems:   Atrial fibrillation (HCC)   BPH (benign prostatic hyperplasia)   Hyperlipidemia associated with type 2 diabetes mellitus (HCC)   OSA on CPAP   Type 2 diabetes mellitus with peripheral angiopathy (HCC)   Hypertension associated with diabetes (HCC) Moderate pericardial effusion    Hospital Course: 89yom PMH including DM type 2, OSA on CPAP presented w/ SOB for 2 weeks. Outpatient CTA showed acute PE, also new dx of afib at PCP, was given diltiazem  and apixaban . Admitted for acute PE.  Condition rapidly improved and patient was discharged home.  Consultants None   Procedures/Events   Acute PE Moderate pericardial effusion Outpatient CTA chest showed acute PE involving segmental and subsegmental branches in right upper/middle/lower and left lower lobe pulmonary arteries and lingular branches. CT suggestive of at least submassive PE. Small to moderate left and small right pleural effusions and small pericardial effusion also noted.  Treated with heparin  and converted to apixaban . Echo reassuring.  Discussed echo with critical care.  Anticoagulation only recommended. BLE venous dopplers negative.   Atrial fibrillation, new diagnosis Thought secondary to acute PE TSH high.  Can follow-up as an outpatient.  Suggest retesting after recovered. Diltiazem  and apixaban  as ordered by PCP.   Essential HTN   DME type 2 Can resume metformin    Asthma Normocytic anemia, stable BPH OSA  Elevated TSH Follow-up  as an outpatient when recovered.    Disposition: Home Diet recommendation:  Regular diet DISCHARGE MEDICATION: Allergies as of 07/24/2024       Reactions   Codeine Shortness Of Breath, Rash   Dilaudid [hydromorphone Hcl] Other (See Comments)   Stopped breathing   Morphine And Codeine Shortness Of Breath, Rash   Percocet [oxycodone-acetaminophen ] Shortness Of Breath, Rash   Versed [midazolam] Anaphylaxis   Vicodin [hydrocodone-acetaminophen ] Shortness Of Breath, Rash   Ambien [zolpidem Tartrate]    Hallucinations         Medication List     STOP taking these medications    apixaban  2.5 MG Tabs tablet Commonly known as: ELIQUIS  Replaced by: Apixaban  Starter Pack (10mg  and 5mg )   aspirin  EC 81 MG tablet   losartan  100 MG tablet Commonly known as: COZAAR        TAKE these medications    acetaminophen  500 MG tablet Commonly known as: TYLENOL  Take 1,000 mg by mouth every 8 (eight) hours as needed for moderate pain (pain score 4-6) or headache.   acidophilus Caps capsule Take 1 capsule by mouth daily.   albuterol  (2.5 MG/3ML) 0.083% nebulizer solution Commonly known as: PROVENTIL  Take 2.5 mg by nebulization every 6 (six) hours as needed for wheezing or shortness of breath.   Apixaban  Starter Pack (10mg  and 5mg ) Commonly known as: ELIQUIS  STARTER PACK Take as directed on package: start with two-5mg  tablets twice daily for 7 days. On day 8, switch to one-5mg  tablet twice daily. Replaces: apixaban  2.5 MG Tabs tablet   atorvastatin  10 MG tablet Commonly known as: LIPITOR Take 10 mg by mouth at bedtime.   Colchicine  0.6 MG Caps Take 0.6 mg  by mouth 2 (two) times daily as needed (gout).   diltiazem  120 MG 24 hr capsule Commonly known as: CARDIZEM  CD Take 1 capsule (120 mg total) by mouth daily.   doxazosin 4 MG tablet Commonly known as: CARDURA Take 4 mg by mouth daily.   famotidine  40 MG tablet Commonly known as: PEPCID  Take 40 mg by mouth at bedtime.    furosemide  20 MG tablet Commonly known as: LASIX  Take 20 mg by mouth daily.   gabapentin  100 MG capsule Commonly known as: NEURONTIN  Take 200 mg by mouth every evening.   glimepiride  1 MG tablet Commonly known as: AMARYL  Take 0.5 mg by mouth daily with breakfast.   metFORMIN  500 MG tablet Commonly known as: GLUCOPHAGE  Take 1,000 mg by mouth 2 (two) times daily with a meal.   montelukast  10 MG tablet Commonly known as: SINGULAIR  Take 10 mg by mouth daily.   pantoprazole  40 MG tablet Commonly known as: PROTONIX  Take 1 tablet (40 mg total) by mouth daily. What changed: when to take this   tamsulosin  0.4 MG Caps capsule Commonly known as: FLOMAX  Take 0.4 mg by mouth daily.        Follow-up Information     Cleotilde Oneil FALCON, MD. Schedule an appointment as soon as possible for a visit in 1 week(s).   Specialty: Internal Medicine Contact information: [address] [facility] [place] [address] [phone]                Discharge Exam: Filed Weights   07/23/24 1327 07/24/24 0617  Weight: 131.5 kg 128.9 kg   Physical Exam Vitals reviewed.  Constitutional:      General: He is not in acute distress.    Appearance: He is not ill-appearing or toxic-appearing.  Cardiovascular:     Rate and Rhythm: Normal rate and regular rhythm.     Heart sounds: No murmur heard.    Comments: Telemetry afib Pulmonary:     Effort: Pulmonary effort is normal. No respiratory distress.     Breath sounds: No wheezing, rhonchi or rales.  Musculoskeletal:     Right lower leg: No edema.     Left lower leg: No edema.  Neurological:     Mental Status: He is alert.  Psychiatric:        Mood and Affect: Mood normal.        Behavior: Behavior normal.    CBG stable Mg2+ 1.5 BNP 1769 Troponins 33, 31 TSH 6.68 BMP noted Hgb stable 10.5  Condition at discharge: good  The results of significant diagnostics from this hospitalization (including  imaging, microbiology, ancillary and laboratory) are listed below for reference.   Imaging Studies: VAS US  LOWER EXTREMITY VENOUS (DVT) Result Date: 07/24/2024  Lower Venous DVT Study Patient Name:  Keith Cain  Date of Exam:   07/24/2024 Medical Rec #: [id number]       Accession #:    [id number] Date of Birth: 1931/09/28       Patient Gender: M Patient Age:   62 years Exam Location:  [facility] Procedure:      VAS US  LOWER EXTREMITY VENOUS (DVT) Referring Phys: JORIE PATEL --------------------------------------------------------------------------------  Indications: Pulmonary embolism, and Elevated d-Dimer.  Anticoagulation: Heparin . Comparison Study: No priors. Performing Technologist: Ricka Sturdivant-Jones RDMS, RVT  Examination Guidelines: A complete evaluation includes B-mode imaging, spectral Doppler, color Doppler, and power Doppler as needed of all accessible portions of each vessel. Bilateral testing is considered an integral part  of a complete examination. Limited examinations for reoccurring indications may be performed as noted. The reflux portion of the exam is performed with the patient in reverse Trendelenburg.  +---------+---------------+---------+-----------+----------+--------------+ RIGHT    CompressibilityPhasicitySpontaneityPropertiesThrombus Aging +---------+---------------+---------+-----------+----------+--------------+ CFV      Full           Yes      Yes                                 +---------+---------------+---------+-----------+----------+--------------+ SFJ      Full                                                        +---------+---------------+---------+-----------+----------+--------------+ FV Prox  Full                                                        +---------+---------------+---------+-----------+----------+--------------+ FV Mid   Full           Yes      Yes                                  +---------+---------------+---------+-----------+----------+--------------+ FV DistalFull                                                        +---------+---------------+---------+-----------+----------+--------------+ PFV      Full                                                        +---------+---------------+---------+-----------+----------+--------------+ POP      Full           Yes      Yes                                 +---------+---------------+---------+-----------+----------+--------------+ PTV      Full                                                        +---------+---------------+---------+-----------+----------+--------------+ PERO     Full                                                        +---------+---------------+---------+-----------+----------+--------------+     Summary: BILATERAL: - No evidence of deep vein thrombosis seen in the lower extremities, bilaterally. -No evidence of popliteal cyst,  bilaterally.   *See table(s) above for measurements and observations.    Preliminary    CT Angio Chest Pulmonary Embolism (PE) W or WO Contrast Addendum Date: 07/23/2024  ADDENDUM #1  ADDENDUM: I discussed these critical results by telephone with Mclaughlin, Miriam at 12:28 pm on 07/23/2024. ---------------------------------------------------- Electronically signed by: Ryan Salvage MD 07/23/2024 01:40 PM EST RP Workstation: [id number]   Result Date: 07/23/2024  ORIGINAL REPORT  EXAM: CTA of the Chest with contrast for PE 07/23/2024 12:05:19 PM TECHNIQUE: CTA of the chest was performed after the administration of 100 mL iohexol  (OMNIPAQUE ) 350 MG/ML injection. Multiplanar reformatted images are provided for review. MIP images are provided for review. Automated exposure control, iterative reconstruction, and/or weight based adjustment of the mA/kV was utilized to reduce the radiation dose to as low as reasonably achievable. COMPARISON: 07/15/2024  CLINICAL HISTORY: Shortness of breath. FINDINGS: PULMONARY ARTERIES: Pulmonary arteries are adequately opacified for evaluation. New acute pulmonary embolus in the anterior segmental branch of the right upper lobe pulmonary artery, posterior segmental branch of the right upper lobe pulmonary artery, right middle lobar pulmonary artery and its branches, segmental and subsegmental branches of the right lower lobe pulmonary artery, anterior basal segment of the left lower lobe pulmonary artery, and segmental lingular branches of the left pulmonary artery. These are new compared to 07/15/2024. Main pulmonary artery is normal in caliber. MEDIASTINUM: Moderate cardiomegaly. Small but increased pericardial effusion. Thoracic aortic, coronary artery, and branch vessel atheromatous vascular calcifications. There is no acute abnormality of the thoracic aorta. LYMPH NODES: Right eccentric subcarinal lymph node 1.3 cm in short axis on image 73 series 4, previously 1.2 cm by my measurements. No hilar or axillary lymphadenopathy. LUNGS AND PLEURA: Small to moderate left and small right pleural effusion with passive atelectasis. Stable 6 x 3 mm right lower lobe nodule on image 98 series 6. No focal consolidation or pulmonary edema. No pneumothorax. UPPER ABDOMEN: Limited images of the upper abdomen are unremarkable. SOFT TISSUES AND BONES: Degenerative sternoclavicular arthropathy bilaterally. 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 Thoracic spondylosis. 2.4 x 1.6 cm immediately subcutaneous lesion on the upper back near the midline on image 15 series 4, unchanged, probably a sebaceous cyst or similar benign lesion. IMPRESSION: 1. Acute pulmonary emboli involving segmental and subsegmental branches in the right upper, right middle, right lower, and left lower lobe pulmonary arteries, and lingular branches. Right ventricular to left ventricular ratio is 1.1 compatible with at least submassive  pulmonary embolus. 2. Small to moderate left and small right pleural effusions with passive atelectasis. 3. Moderate cardiomegaly with a small pericardial effusion. 4. Mildly enlarged right eccentric subcarinal lymph node measuring 1.3 cm in short axis. 5. Thoracic aortic, coronary artery, and branch vessel atherosclerotic calcifications. 6. 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 7. Degenerative sternoclavicular arthropathy bilaterally. 8. Thoracic spondylosis. Electronically signed by: Ryan Salvage MD 07/23/2024 12:23 PM EST RP Workstation: [id number]   CT Angio Chest/Abd/Pel for Dissection W and/or Wo Contrast Result Date: 07/15/2024 CLINICAL DATA:  Acute aortic syndrome EXAM: CT ANGIOGRAPHY CHEST, ABDOMEN AND PELVIS TECHNIQUE: Non-contrast CT of the chest was initially obtained. Multidetector CT imaging through the chest, abdomen and pelvis was performed using the standard protocol during bolus administration of intravenous contrast. Multiplanar reconstructed images and MIPs were obtained and reviewed to evaluate the vascular anatomy. RADIATION DOSE REDUCTION: This exam was performed according to the departmental dose-optimization program which includes automated exposure control, adjustment of the  mA and/or kV according to patient size and/or use of iterative reconstruction technique. CONTRAST:  OMNIPAQUE  IOHEXOL  350 MG/ML SOLN COMPARISON:  July 20, 2021.  February 16, 2013. FINDINGS: CTA CHEST FINDINGS Cardiovascular: Atherosclerosis of thoracic aorta is noted without aneurysm or dissection. No definite evidence of pulmonary embolus. Cardiac size. Small pericardial effusion. Mild coronary artery calcifications are noted. Mediastinum/Nodes: No enlarged mediastinal, hilar, or axillary lymph nodes. Thyroid gland, trachea, and esophagus demonstrate no significant findings. Lungs/Pleura: No pneumothorax or pleural effusion is noted. 4 mm nodule is noted in right lower lobe. This is  best seen on image number 108 of series 7. No significant consolidative process is noted. Musculoskeletal: No chest wall abnormality. No acute or significant osseous findings. Review of the MIP images confirms the above findings. CTA ABDOMEN AND PELVIS FINDINGS VASCULAR Aorta: Atherosclerosis of abdominal aorta is noted without aneurysm or dissection. Celiac: Patent without evidence of aneurysm, dissection, vasculitis or significant stenosis. SMA: Patent without evidence of aneurysm, dissection, vasculitis or significant stenosis. Renals: Both renal arteries are patent without evidence of aneurysm, dissection, vasculitis, fibromuscular dysplasia or significant stenosis. IMA: Severe narrowing is noted at origin secondary to calcified plaque. Inflow: Patent without evidence of aneurysm, dissection, vasculitis or significant stenosis. Veins: No obvious venous abnormality within the limitations of this arterial phase study. Review of the MIP images confirms the above findings. NON-VASCULAR Hepatobiliary: No focal liver abnormality is seen. No gallstones, gallbladder wall thickening, or biliary dilatation. Pancreas: Unremarkable. No pancreatic ductal dilatation or surrounding inflammatory changes. Spleen: Normal in size without focal abnormality. Adrenals/Urinary Tract: Adrenal glands are unremarkable. Kidneys are normal, without renal calculi, focal lesion, or hydronephrosis. Bladder is unremarkable. Stomach/Bowel: Status post appendectomy. Stomach is unremarkable. There is no evidence of bowel obstruction. Diverticulosis of descending and sigmoid colon is noted. No definite bowel inflammation noted. Lymphatic: No definite adenopathy is noted. Reproductive: Prostate gland is not well visualized due to scatter artifact arising from bilateral hip arthroplasties. Other: Moderate size fat containing left inguinal hernia is noted. No ascites is noted. Musculoskeletal: Status post bilateral total hip arthroplasties as noted  above. No acute osseous abnormality is noted. Review of the MIP images confirms the above findings. IMPRESSION: 1. No evidence of thoracic or abdominal aortic dissection or aneurysm. 2. Small pericardial effusion. 3. Mild coronary artery calcifications are noted. 4. 4 mm nodule is noted in right lower lobe. No follow-up needed if patient is low-risk.This recommendation follows the consensus statement: Guidelines for Management of Incidental Pulmonary Nodules Detected on CT Images: From the Fleischner Society 2017; Radiology 2017; 284:228-243. 5. 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 6. Diverticulosis of descending and sigmoid colon without inflammation. 7. Moderate size fat containing left inguinal hernia. 8. Aortic atherosclerosis. Aortic Atherosclerosis (ICD10-I70.0). Electronically Signed   By: Lynwood Landy Raddle M.D.   On: 07/15/2024 12:03   DG Chest Port 1 View Result Date: 07/15/2024 CLINICAL DATA:  Chest pain. EXAM: PORTABLE CHEST 1 VIEW COMPARISON:  Chest radiograph dated 07/20/2021. FINDINGS: There is cardiomegaly. Mild vascular congestion. No focal consolidation, pleural effusion or pneumothorax. No acute osseous pathology. IMPRESSION: Cardiomegaly with mild vascular congestion. Electronically Signed   By: Vanetta Chou M.D.   On: 07/15/2024 10:04    Microbiology: Results for orders placed or performed during the hospital encounter of 07/20/21  Blood Culture (routine x 2)     Status: None   Collection Time: 07/20/21  7:05 PM   Specimen: BLOOD  Result Value  Ref Range Status   Specimen Description BLOOD BLOOD RIGHT FOREARM  Final   Special Requests BOTTLES DRAWN AEROBIC AND ANAEROBIC BCAV  Final   Culture   Final    NO GROWTH 5 DAYS Performed at [facility], [address] [place], [place] [address]    Report Status 07/25/2021 FINAL  Final  Blood Culture (routine x 2)     Status: None   Collection Time: 07/20/21  7:53  PM   Specimen: BLOOD  Result Value Ref Range Status   Specimen Description BLOOD [facility]  Final   Special Requests BOTTLES DRAWN AEROBIC AND ANAEROBIC BCAV  Final   Culture   Final    NO GROWTH 5 DAYS Performed at [facility], [address] [place], [place] [address]    Report Status 07/25/2021 FINAL  Final  Resp Panel by RT-PCR (Flu A&B, Covid) Nasopharyngeal Swab     Status: None   Collection Time: 07/20/21  8:44 PM   Specimen: Nasopharyngeal Swab; Nasopharyngeal(NP) swabs in vial transport medium  Result Value Ref Range Status   SARS Coronavirus 2 by RT PCR NEGATIVE NEGATIVE Final    Comment: (NOTE) SARS-CoV-2 target nucleic acids are NOT DETECTED.  The SARS-CoV-2 RNA is generally detectable in upper respiratory specimens during the acute phase of infection. The lowest concentration of SARS-CoV-2 viral copies this assay can detect is 138 copies/mL. A negative result does not preclude SARS-Cov-2 infection and should not be used as the sole basis for treatment or other patient management decisions. A negative result may occur with  improper specimen collection/handling, submission of specimen other than nasopharyngeal swab, presence of viral mutation(s) within the areas targeted by this assay, and inadequate number of viral copies(<138 copies/mL). A negative result must be combined with clinical observations, patient history, and epidemiological information. The expected result is Negative.  Fact Sheet for Patients:  bloggercourse.com  Fact Sheet for Healthcare Providers:  seriousbroker.it  This test is no t yet approved or cleared by the [place]  FDA and  has been authorized for detection and/or diagnosis of SARS-CoV-2 by FDA under an Emergency Use Authorization (EUA). This EUA will remain  in effect (meaning this test can be used) for the duration of the COVID-19 declaration under Section 564(b)(1) of the Act,  21 U.S.C.section 360bbb-3(b)(1), unless the authorization is terminated  or revoked sooner.       Influenza A by PCR NEGATIVE NEGATIVE Final   Influenza B by PCR NEGATIVE NEGATIVE Final    Comment: (NOTE) The Xpert Xpress SARS-CoV-2/FLU/RSV plus assay is intended as an aid in the diagnosis of influenza from Nasopharyngeal swab specimens and should not be used as a sole basis for treatment. Nasal washings and aspirates are unacceptable for Xpert Xpress SARS-CoV-2/FLU/RSV testing.  Fact Sheet for Patients: bloggercourse.com  Fact Sheet for Healthcare Providers: seriousbroker.it  This test is not yet approved or cleared by the [place]  FDA and has been authorized for detection and/or diagnosis of SARS-CoV-2 by FDA under an Emergency Use Authorization (EUA). This EUA will remain in effect (meaning this test can be used) for the duration of the COVID-19 declaration under Section 564(b)(1) of the Act, 21 U.S.C. section 360bbb-3(b)(1), unless the authorization is terminated or revoked.  Performed at [facility], [address], [place], [place] [address]   Urine Culture     Status: Abnormal   Collection Time: 07/20/21  8:55 PM   Specimen: Urine, Random  Result Value Ref Range Status   Specimen  Description   Final    URINE, RANDOM Performed at [facility], [address] [place], [place] [address]    Special Requests   Final    NONE Performed at [facility], [address] [place], [place] [address]    Culture MULTIPLE SPECIES PRESENT, SUGGEST RECOLLECTION (A)  Final   Report Status 07/22/2021 FINAL  Final    Labs: CBC: Recent Labs  Lab 07/23/24 1613 07/24/24 0131  WBC 9.7 7.6  HGB 10.9* 10.5*  HCT 35.3* 34.1*  MCV 89.8 89.5  PLT 316 322   Basic Metabolic Panel: Recent Labs  Lab 07/23/24 1613 07/24/24 0131  NA 139 138  K 3.7 3.7  CL 103 103  CO2 21* 23  GLUCOSE 115*  179*  BUN 18 18  CREATININE 1.23 1.21  CALCIUM  8.8* 8.8*  MG  --  1.5*   Liver Function Tests: No results for input(s): AST, ALT, ALKPHOS, BILITOT, PROT, ALBUMIN in the last 168 hours. CBG: Recent Labs  Lab 07/23/24 2219 07/24/24 0715 07/24/24 1151  GLUCAP 169* 168* 173*    Discharge time spent: greater than 30 minutes.  Signed: Toribio Door, MD [facility] 07/24/2024 "

## 2024-07-24 NOTE — Progress Notes (Signed)
 Discharge medications delivered to patient at the bedside in a secure bag.

## 2024-07-24 NOTE — Progress Notes (Signed)
 2D echo attempted, pt care needed. Will try later

## 2024-07-24 NOTE — Telephone Encounter (Signed)
 Pharmacy Patient Advocate Encounter  Insurance verification completed.    The patient is insured through [employer]     Ran test claim for Eliquis  5mg  tablet and the current 30 day co-pay is $0.  Ran test claim for Xarelto 20mg  tablet and the current 30 day co-pay is $0.  This test claim was processed through [employer]- copay amounts may vary at other pharmacies due to [employer], or as the patient moves through the different stages of their insurance plan.

## 2024-08-03 ENCOUNTER — Ambulatory Visit: Admitting: Cardiovascular Disease
